# Patient Record
Sex: Male | Born: 1985 | Race: Black or African American | Hispanic: No | Marital: Single | Smoking: Never smoker
Health system: Southern US, Community
[De-identification: ages and names within clinical notes are randomized; demographics above are authoritative.]

## PROBLEM LIST (undated history)

## (undated) ENCOUNTER — Ambulatory Visit (HOSPITAL_COMMUNITY): Admission: EM

---

## 2014-03-26 ENCOUNTER — Emergency Department (HOSPITAL_COMMUNITY)
Admission: EM | Admit: 2014-03-26 | Discharge: 2014-03-27 | Disposition: A | Discharge status: Home / Self Care | Payer: No Typology Code available for payment source | Attending: Emergency Medicine | Admitting: Emergency Medicine

## 2014-03-26 ENCOUNTER — Encounter (HOSPITAL_COMMUNITY): Payer: Self-pay | Admitting: Emergency Medicine

## 2014-03-26 ENCOUNTER — Emergency Department (HOSPITAL_COMMUNITY): Payer: No Typology Code available for payment source

## 2014-03-26 DIAGNOSIS — S335XXA Sprain of ligaments of lumbar spine, initial encounter: Secondary | ICD-10-CM | POA: Insufficient documentation

## 2014-03-26 DIAGNOSIS — S39012A Strain of muscle, fascia and tendon of lower back, initial encounter: Secondary | ICD-10-CM

## 2014-03-26 DIAGNOSIS — Y9389 Activity, other specified: Secondary | ICD-10-CM | POA: Insufficient documentation

## 2014-03-26 DIAGNOSIS — S161XXA Strain of muscle, fascia and tendon at neck level, initial encounter: Secondary | ICD-10-CM

## 2014-03-26 DIAGNOSIS — Y92410 Unspecified street and highway as the place of occurrence of the external cause: Secondary | ICD-10-CM | POA: Insufficient documentation

## 2014-03-26 DIAGNOSIS — S139XXA Sprain of joints and ligaments of unspecified parts of neck, initial encounter: Secondary | ICD-10-CM | POA: Insufficient documentation

## 2014-03-26 MED ORDER — IBUPROFEN 800 MG PO TABS: 800.0000 mg | ORAL_TABLET | Freq: Three times a day (TID) | ORAL | Status: DC | PRN

## 2014-03-26 MED ORDER — HYDROCODONE-ACETAMINOPHEN 5-325 MG PO TABS: 1.0000 | ORAL_TABLET | Freq: Four times a day (QID) | ORAL | Status: DC | PRN
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
## 2014-03-26 NOTE — Discharge Instructions (Signed)
Return here as needed. Your x-rays were normal. Use ice and heat on your neck and back.  °

## 2014-03-27 NOTE — ED Provider Notes (Signed)
Medical screening examination/treatment/procedure(s) were performed by non-physician practitioner and as supervising physician I was immediately available for consultation/collaboration.  Khrystina Bonnes T Avelardo Reesman, MD 03/27/14 1552 

## 2014-12-28 ENCOUNTER — Emergency Department (HOSPITAL_COMMUNITY)
Admission: EM | Admit: 2014-12-28 | Discharge: 2014-12-29 | Disposition: A | Discharge status: Home / Self Care | Payer: 59 | Attending: Emergency Medicine | Admitting: Emergency Medicine

## 2014-12-28 ENCOUNTER — Encounter (HOSPITAL_COMMUNITY): Payer: Self-pay | Admitting: Emergency Medicine

## 2014-12-28 DIAGNOSIS — B9789 Other viral agents as the cause of diseases classified elsewhere: Secondary | ICD-10-CM

## 2014-12-28 DIAGNOSIS — J069 Acute upper respiratory infection, unspecified: Secondary | ICD-10-CM | POA: Diagnosis not present

## 2014-12-28 DIAGNOSIS — R05 Cough: Secondary | ICD-10-CM | POA: Diagnosis present

## 2014-12-28 NOTE — ED Notes (Signed)
Pt. reports productive cough , runny nose , chest congestion and headache onset this week , denies fever or chills.

## 2014-12-29 MED ORDER — BENZONATATE 100 MG PO CAPS
100.0000 mg | ORAL_CAPSULE | Freq: Once | ORAL | Status: AC
  Administered 2014-12-29: 100 mg via ORAL
  Filled 2014-12-29: qty 1

## 2014-12-29 MED ORDER — HYDROCODONE-ACETAMINOPHEN 5-325 MG PO TABS
1.0000 | ORAL_TABLET | Freq: Once | ORAL | Status: AC
  Administered 2014-12-29: 1 via ORAL
  Filled 2014-12-29: qty 1

## 2014-12-29 MED ORDER — BENZONATATE 100 MG PO CAPS: 100.0000 mg | ORAL_CAPSULE | Freq: Three times a day (TID) | ORAL | Status: DC

## 2014-12-29 MED ORDER — NAPROXEN 500 MG PO TABS: 500.0000 mg | ORAL_TABLET | Freq: Two times a day (BID) | ORAL | Status: DC

## 2014-12-29 MED ORDER — SALINE SPRAY 0.65 % NA SOLN: 1.0000 | NASAL | Status: DC | PRN

## 2014-12-29 NOTE — ED Notes (Signed)
Patient is alert and orientedx4.  Patient was explained discharge instructions and they understood them with no questions.  The patient's wife Paulino RilyFlorence Weah is taking the patient home.

## 2014-12-29 NOTE — ED Provider Notes (Signed)
CSN: [id number]     Arrival date & time 12/28/14  2349 History   First MD Initiated Contact with Patient 12/29/14 0013     Chief Complaint  Patient presents with  . Cough    (Consider location/radiation/quality/duration/timing/severity/associated sxs/prior Treatment) Patient is a 29 y.o. male presenting with cough. The history is provided by the patient. No language interpreter was used.  Cough Cough characteristics:  Non-productive Severity:  Moderate Onset quality:  Gradual Duration:  3 days Timing:  Intermittent Progression:  Worsening Chronicity:  New Smoker: no   Relieved by:  Nothing Worsened by:  Deep breathing Ineffective treatments: Ibuprofen and sinus congestion tablet. Associated symptoms: chest pain (R anterior chest wall), rhinorrhea and sinus congestion   Associated symptoms: no fever, no shortness of breath and no sore throat   Risk factors comment:  +sick contacts at work   History reviewed. No pertinent past medical history. History reviewed. No pertinent past surgical history. No family history on file. History  Substance Use Topics  . Smoking status: Never Smoker   . Smokeless tobacco: Never Used  . Alcohol Use: Yes     Comment: Socailly     Review of Systems  Constitutional: Positive for fatigue. Negative for fever.  HENT: Positive for congestion and rhinorrhea. Negative for sore throat and trouble swallowing.   Respiratory: Positive for cough. Negative for shortness of breath.   Cardiovascular: Positive for chest pain (R anterior chest wall).  Gastrointestinal: Negative for nausea and vomiting.  All other systems reviewed and are negative.   Allergies  Review of patient's allergies indicates no known allergies.  Home Medications   Prior to Admission medications   Medication Sig Start Date End Date Taking? Authorizing Provider  benzonatate (TESSALON) 100 MG capsule Take 1 capsule (100 mg total) by mouth every 8 (eight) hours. 12/29/14   Antony MaduraKelly  Adonai Selsor, PA-C  HYDROcodone-acetaminophen (NORCO/VICODIN) 5-325 MG per tablet Take 1 tablet by mouth every 6 (six) hours as needed for moderate pain. 03/26/14   Jamesetta Orleanshristopher W Lawyer, PA-C  ibuprofen (ADVIL,MOTRIN) 800 MG tablet Take 1 tablet (800 mg total) by mouth every 8 (eight) hours as needed. 03/26/14   Jamesetta Orleanshristopher W Lawyer, PA-C  naproxen (NAPROSYN) 500 MG tablet Take 1 tablet (500 mg total) by mouth 2 (two) times daily. 12/29/14   Antony MaduraKelly Aeron Donaghey, PA-C  sodium chloride (OCEAN) 0.65 % SOLN nasal spray Place 1 spray into both nostrils as needed for congestion. 12/29/14   Antony MaduraKelly Goldman Birchall, PA-C   BP 117/74 mmHg  Pulse 87  Temp(Src) 98.1 F (36.7 C) (Oral)  Resp 17  Ht 5\' 8"  (1.727 m)  Wt 193 lb (87.544 kg)  BMI 29.35 kg/m2  SpO2 97%   Physical Exam  Constitutional: He is oriented to person, place, and time. He appears well-developed and well-nourished. No distress.  Nontoxic/nonseptic appearing  HENT:  Head: Normocephalic and atraumatic.  Right Ear: Tympanic membrane, external ear and ear canal normal.  Left Ear: Tympanic membrane, external ear and ear canal normal.  Nose: Rhinorrhea present.  Mouth/Throat: Uvula is midline and mucous membranes are normal. Posterior oropharyngeal erythema present. No oropharyngeal exudate.  Mild posterior erythema in the oropharynx. Uvula midline. Patient tolerating secretions without difficulty.  Eyes: Conjunctivae and EOM are normal. No scleral icterus.  Neck: Normal range of motion.  Cardiovascular: Normal rate, regular rhythm and normal heart sounds.   Pulmonary/Chest: Effort normal and breath sounds normal. No respiratory distress. He has no wheezes. He has no rales. He exhibits tenderness and bony  tenderness. He exhibits no crepitus, no deformity and no retraction.    Lungs clear bilaterally. No wheezing or rales. No accessory muscle use. No tachypnea or dyspnea.  Abdominal: Soft. He exhibits no distension. There is no tenderness.  Soft, nontender. No masses.   Musculoskeletal: Normal range of motion.  Neurological: He is alert and oriented to person, place, and time. He exhibits normal muscle tone. Coordination normal.  Skin: Skin is warm and dry. No rash noted. He is not diaphoretic. No erythema. No pallor.  Psychiatric: He has a normal mood and affect. His behavior is normal.  Nursing note and vitals reviewed.   ED Course  Procedures (including critical care time) Labs Review Labs Reviewed - No data to display  Imaging Review No results found.   EKG Interpretation None      MDM   Final diagnoses:  Viral URI with cough    Patients symptoms are consistent with URI, likely viral etiology. Physical exam also consistent with costochondral chest pain secondary to frequent coughing. Discussed that antibiotics are not indicated for viral infections. Doubt PNA given lack of fever, tachypnea, dyspnea, or hypoxia. Lungs CTAB. No indication for further work up/imaging. Patient will be discharged with symptomatic treatment. Patient verbalizes understanding and is agreeable with plan. Patient is hemodynamically stable and in NAD prior to discharge. Patient discharged in good condition; vitals WNL.   Filed Vitals:   12/28/14 2354  BP: 117/74  Pulse: 87  Temp: 98.1 F (36.7 C)  TempSrc: Oral  Resp: 17  Height:  (1.727 m)  Weight: 193 lb (87.544 kg)  SpO2: 97%        Antony Madura, PA-C 12/29/14 0038  Dione Booze, MD 12/29/14 0104

## 2014-12-29 NOTE — Discharge Instructions (Signed)
Recommend Naproxen and Tessalon as prescribed. Do not take ibuprofen/Motrin with Naproxen. You may use nasal saline spray for congestion. You may also use over the counter cough medications as needed. Follow up with your primary care doctor for a symptom recheck in 2 days.  Cough, Adult  A cough is a reflex that helps clear your throat and airways. It can help heal the body or may be a reaction to an irritated airway. A cough may only last 2 or 3 weeks (acute) or may last more than 8 weeks (chronic).  CAUSES Acute cough:  Viral or bacterial infections. Chronic cough:  Infections.  Allergies.  Asthma.  Post-nasal drip.  Smoking.  Heartburn or acid reflux.  Some medicines.  Chronic lung problems (COPD).  Cancer. SYMPTOMS   Cough.  Fever.  Chest pain.  Increased breathing rate.  High-pitched whistling sound when breathing (wheezing).  Colored mucus that you cough up (sputum). TREATMENT   A bacterial cough may be treated with antibiotic medicine.  A viral cough must run its course and will not respond to antibiotics.  Your caregiver may recommend other treatments if you have a chronic cough. HOME CARE INSTRUCTIONS   Only take over-the-counter or prescription medicines for pain, discomfort, or fever as directed by your caregiver. Use cough suppressants only as directed by your caregiver.  Use a cold steam vaporizer or humidifier in your bedroom or home to help loosen secretions.  Sleep in a semi-upright position if your cough is worse at night.  Rest as needed.  Stop smoking if you smoke. SEEK IMMEDIATE MEDICAL CARE IF:   You have pus in your sputum.  Your cough starts to worsen.  You cannot control your cough with suppressants and are losing sleep.  You begin coughing up blood.  You have difficulty breathing.  You develop pain which is getting worse or is uncontrolled with medicine.  You have a fever. MAKE SURE YOU:   Understand these  instructions.  Will watch your condition.  Will get help right away if you are not doing well or get worse. Document Released: 06/07/2011 Document Revised: 03/02/2012 Document Reviewed: 06/07/2011 [facility] Patient Information 2015 [employer] [place]. This information is not intended to replace advice given to you by your health care provider. Make sure you discuss any questions you have with your health care provider. Costochondritis Costochondritis, sometimes called Tietze syndrome, is a swelling and irritation (inflammation) of the tissue (cartilage) that connects your ribs with your breastbone (sternum). It causes pain in the chest and rib area. Costochondritis usually goes away on its own over time. It can take up to 6 weeks or longer to get better, especially if you are unable to limit your activities. CAUSES  Some cases of costochondritis have no known cause. Possible causes include:  Injury (trauma).  Exercise or activity such as lifting.  Severe coughing. SIGNS AND SYMPTOMS  Pain and tenderness in the chest and rib area.  Pain that gets worse when coughing or taking deep breaths.  Pain that gets worse with specific movements. DIAGNOSIS  Your health care provider will do a physical exam and ask about your symptoms. Chest X-rays or other tests may be done to rule out other problems. TREATMENT  Costochondritis usually goes away on its own over time. Your health care provider may prescribe medicine to help relieve pain. HOME CARE INSTRUCTIONS   Avoid exhausting physical activity. Try not to strain your ribs during normal activity. This would include any activities using chest, abdominal, and  side muscles, especially if heavy weights are used.  Apply ice to the affected area for the first 2 days after the pain begins.  Put ice in a plastic bag.  Place a towel between your skin and the bag.  Leave the ice on for 20 minutes, 2-3 times a day.  Only take over-the-counter or  prescription medicines as directed by your health care provider. SEEK MEDICAL CARE IF:  You have redness or swelling at the rib joints. These are signs of infection.  Your pain does not go away despite rest or medicine. SEEK IMMEDIATE MEDICAL CARE IF:   Your pain increases or you are very uncomfortable.  You have shortness of breath or difficulty breathing.  You cough up blood.  You have worse chest pains, sweating, or vomiting.  You have a fever or persistent symptoms for more than 2-3 days.  You have a fever and your symptoms suddenly get worse. MAKE SURE YOU:   Understand these instructions.  Will watch your condition.  Will get help right away if you are not doing well or get worse. Document Released: 09/18/2005 Document Revised: 09/29/2013 Document Reviewed: 07/13/2013 [facility] Patient Information 2015 [place], [place]. This information is not intended to replace advice given to you by your health care provider. Make sure you discuss any questions you have with your health care provider.

## 2014-12-30 ENCOUNTER — Emergency Department (HOSPITAL_COMMUNITY)
Admission: EM | Admit: 2014-12-30 | Discharge: 2014-12-30 | Disposition: A | Discharge status: Home / Self Care | Payer: 59 | Attending: Emergency Medicine | Admitting: Emergency Medicine

## 2014-12-30 ENCOUNTER — Encounter (HOSPITAL_COMMUNITY): Payer: Self-pay | Admitting: Emergency Medicine

## 2014-12-30 ENCOUNTER — Emergency Department (HOSPITAL_COMMUNITY): Payer: 59

## 2014-12-30 DIAGNOSIS — R059 Cough, unspecified: Secondary | ICD-10-CM

## 2014-12-30 DIAGNOSIS — J40 Bronchitis, not specified as acute or chronic: Secondary | ICD-10-CM

## 2014-12-30 DIAGNOSIS — R05 Cough: Secondary | ICD-10-CM | POA: Diagnosis present

## 2014-12-30 DIAGNOSIS — R0789 Other chest pain: Secondary | ICD-10-CM

## 2014-12-30 MED ORDER — IBUPROFEN 200 MG PO TABS
600.0000 mg | ORAL_TABLET | Freq: Once | ORAL | Status: AC
  Administered 2014-12-30: 600 mg via ORAL
  Filled 2014-12-30: qty 3

## 2014-12-30 NOTE — ED Notes (Signed)
Pt A&OX4, ambulatory at d/c with steady gait, NAD 

## 2014-12-30 NOTE — ED Notes (Signed)
Pt. requesting additional 2 days off from work " work note" , seenhere 2 days ago for cold symptoms discharged with prescription medications diagnosed with viral URI/cough.

## 2014-12-30 NOTE — ED Provider Notes (Signed)
CSN: [id number]     Arrival date & time 12/30/14  0004 History  This chart was scribed for Loren Raceravid Junie Engram, MD by Annye AsaAnna Dorsett, ED Scribe. This patient was seen in room A08C/A08C and the patient's care was started at 2:05 AM.    Chief Complaint  Patient presents with  . Follow-up   HPI   HPI Comments: Steven PeaksSamuel Kidd is an otherwise healthy 29 y.o. male who presents to the Emergency Department complaining of 3 days of URI symptoms: he reports productive cough (green phlegm), right side pain worse with coughing and movement. His sore throat has resolved at present. He has not taken any medications for his symptoms today. He denies rhinorrhea, fever.   Patient was seen here 2 days PTA for the same symptoms; at that time he was discharged with prescription medications for a viral URI/cough. He was told "by his job" to return to the doctor for a work note to be excused from work until he feels well enough to return.   History reviewed. No pertinent past medical history. History reviewed. No pertinent past surgical history. No family history on file. History  Substance Use Topics  . Smoking status: Never Smoker   . Smokeless tobacco: Never Used  . Alcohol Use: Yes     Comment: Socailly     Review of Systems  Constitutional: Positive for fatigue. Negative for fever and chills.  HENT: Negative for congestion, rhinorrhea and sinus pressure.   Respiratory: Positive for cough. Negative for shortness of breath.   Cardiovascular: Positive for chest pain. Negative for palpitations and leg swelling.  Gastrointestinal: Negative for nausea, vomiting, abdominal pain, diarrhea and constipation.  Genitourinary: Negative for dysuria and flank pain.  Musculoskeletal: Positive for myalgias. Negative for back pain, neck pain and neck stiffness.  Skin: Negative for rash and wound.  Neurological: Negative for dizziness, weakness, light-headedness, numbness and headaches.  All other systems reviewed and are  negative.     Allergies  Review of patient's allergies indicates no known allergies.  Home Medications   Prior to Admission medications   Medication Sig Start Date End Date Taking? Authorizing Provider  benzonatate (TESSALON) 100 MG capsule Take 1 capsule (100 mg total) by mouth every 8 (eight) hours. 12/29/14   Antony MaduraKelly Humes, PA-C  HYDROcodone-acetaminophen (NORCO/VICODIN) 5-325 MG per tablet Take 1 tablet by mouth every 6 (six) hours as needed for moderate pain. 03/26/14   Jamesetta Orleanshristopher W Lawyer, PA-C  ibuprofen (ADVIL,MOTRIN) 800 MG tablet Take 1 tablet (800 mg total) by mouth every 8 (eight) hours as needed. 03/26/14   Jamesetta Orleanshristopher W Lawyer, PA-C  naproxen (NAPROSYN) 500 MG tablet Take 1 tablet (500 mg total) by mouth 2 (two) times daily. 12/29/14   Antony MaduraKelly Humes, PA-C  sodium chloride (OCEAN) 0.65 % SOLN nasal spray Place 1 spray into both nostrils as needed for congestion. 12/29/14   Antony MaduraKelly Humes, PA-C   BP 110/72 mmHg  Pulse 80  Temp(Src) 98.6 F (37 C) (Oral)  Resp 18  SpO2 100% Physical Exam  Constitutional: He is oriented to person, place, and time. He appears well-developed and well-nourished. No distress.  HENT:  Head: Normocephalic and atraumatic.  Mouth/Throat: Oropharynx is clear and moist. No oropharyngeal exudate.  Eyes: EOM are normal. Pupils are equal, round, and reactive to light.  Neck: Normal range of motion. Neck supple.  Cardiovascular: Normal rate and regular rhythm.   Pulmonary/Chest: Effort normal and breath sounds normal. No respiratory distress. He has no wheezes. He has no rales. He  exhibits tenderness (Tenderness to palpation over the right inferior lateral chest. there is no crepitance or deformity.).  Abdominal: Soft. Bowel sounds are normal. He exhibits no distension and no mass. There is no tenderness. There is no rebound and no guarding.  Musculoskeletal: Normal range of motion. He exhibits no edema or tenderness.  No lower extremity swelling or pain. Distal  pulses intact.  Neurological: He is alert and oriented to person, place, and time.  Moves all extremities without deficit. Sensation is intact. Ambulating without difficulty.  Skin: Skin is warm and dry. No rash noted. No erythema.  Psychiatric: He has a normal mood and affect. His behavior is normal.  Nursing note and vitals reviewed.   ED Course  Procedures   DIAGNOSTIC STUDIES: Oxygen Saturation is 97% on RA, normal by my interpretation.    COORDINATION OF CARE: 2:10 AM Discussed treatment plan with pt at bedside and pt agreed to plan.   Labs Review Labs Reviewed - No data to display  Imaging Review No results found.   EKG Interpretation None      MDM   Final diagnoses:  Cough  Bronchitis  Right-sided chest wall pain    I personally performed the services described in this documentation, which was scribed in my presence. The recorded information has been reviewed and considered.  Low suspicion for PE. His chest wall pain to pull likely from muscle strain due to coughing. Return precautions given   Loren Racer, MD 01/05/15 1028

## 2014-12-30 NOTE — Discharge Instructions (Signed)

## 2015-11-30 ENCOUNTER — Emergency Department (INDEPENDENT_AMBULATORY_CARE_PROVIDER_SITE_OTHER)
Admission: EM | Admit: 2015-11-30 | Discharge: 2015-11-30 | Disposition: A | Discharge status: Home / Self Care | Payer: Self-pay | Source: Home / Self Care | Attending: Family Medicine | Admitting: Family Medicine

## 2015-11-30 ENCOUNTER — Encounter (HOSPITAL_COMMUNITY): Payer: Self-pay | Admitting: Emergency Medicine

## 2015-11-30 DIAGNOSIS — Z23 Encounter for immunization: Secondary | ICD-10-CM

## 2015-11-30 DIAGNOSIS — S91114A Laceration without foreign body of right lesser toe(s) without damage to nail, initial encounter: Secondary | ICD-10-CM

## 2015-11-30 MED ORDER — TETANUS-DIPHTH-ACELL PERTUSSIS 5-2.5-18.5 LF-MCG/0.5 IM SUSP
0.5000 mL | Freq: Once | INTRAMUSCULAR | Status: AC
  Administered 2015-11-30: 0.5 mL via INTRAMUSCULAR

## 2015-11-30 MED ORDER — TETANUS-DIPHTH-ACELL PERTUSSIS 5-2.5-18.5 LF-MCG/0.5 IM SUSP
INTRAMUSCULAR | Status: AC
  Filled 2015-11-30: qty 0.5
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
## 2015-11-30 NOTE — ED Provider Notes (Signed)
CSN: [id number]     Arrival date & time 11/30/15  1406 History   First MD Initiated Contact with Patient 11/30/15 1456     Chief Complaint  Patient presents with  . Extremity Laceration  . Toe Injury   (Consider location/radiation/quality/duration/timing/severity/associated sxs/prior Treatment) The history is provided by the patient. No language interpreter was used.  Patient complains of pain to right fifth toe, started on 11/25/15 when glass table broke on his foot.  Large pieces of glass, one of which sheared against the lateral edge of his right fifth toe.  Was bleeding heavily, so EMS was called. They evaluated his vital signs and wrapped the toe.  He has been unable to wear steel-toed shoes that he needs for work, so he has not returned there.  COmes today in flip-flops.  Is able to bear weight and has not had any recurrence of bleeding.   ROS No fevers or chills.   History reviewed. No pertinent past medical history. History reviewed. No pertinent past surgical history. History reviewed. No pertinent family history. Social History  Substance Use Topics  . Smoking status: Never Smoker   . Smokeless tobacco: Never Used  . Alcohol Use: Yes     Comment: Socailly     Review of Systems  Allergies  Review of patient's allergies indicates no known allergies.  Home Medications   Prior to Admission medications   Medication Sig Start Date End Date Taking? Authorizing Provider  benzonatate (TESSALON) 100 MG capsule Take 1 capsule (100 mg total) by mouth every 8 (eight) hours. 12/29/14   Antony Madura, PA-C  HYDROcodone-acetaminophen (NORCO/VICODIN) 5-325 MG per tablet Take 1 tablet by mouth every 6 (six) hours as needed for moderate pain. 03/26/14   Charlestine Night, PA-C  ibuprofen (ADVIL,MOTRIN) 800 MG tablet Take 1 tablet (800 mg total) by mouth every 8 (eight) hours as needed. 03/26/14   Charlestine Night, PA-C  naproxen (NAPROSYN) 500 MG tablet Take 1 tablet (500 mg total) by mouth 2  (two) times daily. 12/29/14   Antony Madura, PA-C  sodium chloride (OCEAN) 0.65 % SOLN nasal spray Place 1 spray into both nostrils as needed for congestion. 12/29/14   Antony Madura, PA-C   Meds Ordered and Administered this Visit  Medications - No data to display  BP 126/91 mmHg  Pulse 84  Temp(Src) 98 F (36.7 C) (Oral)  Resp 16  SpO2 100% No data found.   Physical Exam  Constitutional: He appears well-developed and well-nourished. No distress.  HENT:  Head: Normocephalic and atraumatic.  Neck: Normal range of motion. Neck supple.  Musculoskeletal:  Right foot with palpable dp pulse, no edema or erythema.  Lateral aspect of fifth toe with superficial shear of skin, early eschar formation, No purulence or fluctuance. No bleeding. Evaluation wtihout evidence of foreign body in the wound. Distal toe sensation grossly intact and comparable to other toes.  Able to wiggle toes without pain or limitation.    Skin: He is not diaphoretic.    ED Course  Procedures (including critical care time)  Labs Review Labs Reviewed - No data to display  Imaging Review No results found.   Visual Acuity Review  Right Eye Distance:   Left Eye Distance:   Bilateral Distance:    Right Eye Near:   Left Eye Near:    Bilateral Near:         MDM   1. Laceration of fifth toe, right, initial encounter    Shearing laceration of right fifth  toe, does not appear to have soft tissue infection or fb retention. Tdap today. Note for work to return at next shift on 12/12.     Barbaraann BarthelJames O Newton Frutiger, MD 11/30/15 [phone]

## 2015-11-30 NOTE — Discharge Instructions (Signed)
It was a pleasure to see you today for the laceration to your right fifth toe.   As we discussed, it is important to keep it wrapped and prevent it from rubbing against your shoe/boot.  I believe you may return to work, provided it is kept padded while inside the boot.   Tylenol or ibuprofen as needed for the pain.  When you return home from work, you may remove the boots and soak the foot in warm water with epsom salts.  Please return to the urgent care center if the toe becomes red or swollen, if the pain increases, if a pus-like discharge or foul odor develops from the wound, if you develop fevers/chills, or with other concerns.

## 2015-12-26 ENCOUNTER — Encounter (HOSPITAL_COMMUNITY): Payer: Self-pay | Admitting: Emergency Medicine

## 2015-12-26 ENCOUNTER — Emergency Department (INDEPENDENT_AMBULATORY_CARE_PROVIDER_SITE_OTHER)
Admission: EM | Admit: 2015-12-26 | Discharge: 2015-12-26 | Disposition: A | Discharge status: Home / Self Care | Payer: Self-pay | Source: Home / Self Care | Attending: Emergency Medicine | Admitting: Emergency Medicine

## 2015-12-26 DIAGNOSIS — Z5189 Encounter for other specified aftercare: Secondary | ICD-10-CM

## 2015-12-26 NOTE — ED Provider Notes (Signed)
CSN: [id number]     Arrival date & time 12/26/15  1830 History   First MD Initiated Contact with Patient 12/26/15 1910     Chief Complaint  Patient presents with  . Medical Clearance   (Consider location/radiation/quality/duration/timing/severity/associated sxs/prior Treatment) HPI Comments: 30 year old male presents to the urgent care requesting a note to return back to work after injury to the right fifth toe. This occurred early in December. He apparently had a skin avulsion to the right fifth toe. It had been healing well but he states that having to wear steel toed boots at work produced pain and he cannot wear them. He apparently is been out of work since that time and is now 518, back to work wearing his shoes. The wound is healing nicely. It has crusted over. It is dry. There is no erythema, drainage, bleeding or swelling. No lymphangitis. No tenderness.   History reviewed. No pertinent past medical history. History reviewed. No pertinent past surgical history. No family history on file. Social History  Substance Use Topics  . Smoking status: Never Smoker   . Smokeless tobacco: Never Used  . Alcohol Use: Yes     Comment: Socailly     Review of Systems  Constitutional: Negative.   Skin: Positive for wound.  All other systems reviewed and are negative.   Allergies  Review of patient's allergies indicates no known allergies.  Home Medications   Prior to Admission medications   Medication Sig Start Date End Date Taking? Authorizing Provider  benzonatate (TESSALON) 100 MG capsule Take 1 capsule (100 mg total) by mouth every 8 (eight) hours. 12/29/14   Antony MaduraKelly Humes, PA-C  HYDROcodone-acetaminophen (NORCO/VICODIN) 5-325 MG per tablet Take 1 tablet by mouth every 6 (six) hours as needed for moderate pain. 03/26/14   Charlestine Nighthristopher Lawyer, PA-C  ibuprofen (ADVIL,MOTRIN) 800 MG tablet Take 1 tablet (800 mg total) by mouth every 8 (eight) hours as needed. 03/26/14   Charlestine Nighthristopher Lawyer, PA-C   naproxen (NAPROSYN) 500 MG tablet Take 1 tablet (500 mg total) by mouth 2 (two) times daily. 12/29/14   Antony MaduraKelly Humes, PA-C  sodium chloride (OCEAN) 0.65 % SOLN nasal spray Place 1 spray into both nostrils as needed for congestion. 12/29/14   Antony MaduraKelly Humes, PA-C   Meds Ordered and Administered this Visit  Medications - No data to display  BP 133/99 mmHg  Pulse 72  Temp(Src) 98.2 F (36.8 C) (Oral)  Resp 16  SpO2 98% No data found.   Physical Exam  Constitutional: He appears well-developed and well-nourished. No distress.  Pulmonary/Chest: Effort normal. No respiratory distress.  Musculoskeletal: Normal range of motion. He exhibits no edema or tenderness.  See history of present illness for physical description of the right fifth toe.  Neurological: He is alert. He exhibits normal muscle tone.  Skin: Skin is warm and dry. No rash noted. No erythema.  Psychiatric: He has a normal mood and affect.  Nursing note and vitals reviewed.   ED Course  Procedures (including critical care time)  Labs Review Labs Reviewed - No data to display  Imaging Review No results found.   Visual Acuity Review  Right Eye Distance:   Left Eye Distance:   Bilateral Distance:    Right Eye Near:   Left Eye Near:    Bilateral Near:         MDM   1. Encounter for wound re-check    As above, wound is healing well and no signs of infection. May return to  work. Recommend wearing a cushion over the toe to prevent pressure and rubbing.     Hayden Rasmussen, NP 12/26/15 2002

## 2015-12-26 NOTE — Discharge Instructions (Signed)
°  Wound is healing nicely. There are no signs of infection. Continue to keep it clean and dry. He may need to keep it covered and cushioned while wearing your shoes. May return to work.

## 2015-12-26 NOTE — ED Notes (Signed)
Pt reports he needs medical clearance to go back to work... Seen here on 12/8 for a laceration to right 5th toe Steady gait... A&O x4... No acute distress.

## 2018-10-08 ENCOUNTER — Ambulatory Visit (HOSPITAL_COMMUNITY)
Admission: EM | Admit: 2018-10-08 | Discharge: 2018-10-08 | Disposition: A | Discharge status: Home / Self Care | Payer: Self-pay | Attending: Family Medicine | Admitting: Family Medicine

## 2018-10-08 ENCOUNTER — Ambulatory Visit (INDEPENDENT_AMBULATORY_CARE_PROVIDER_SITE_OTHER): Payer: Self-pay

## 2018-10-08 ENCOUNTER — Encounter (HOSPITAL_COMMUNITY): Payer: Self-pay | Admitting: Emergency Medicine

## 2018-10-08 DIAGNOSIS — S93492A Sprain of other ligament of left ankle, initial encounter: Secondary | ICD-10-CM

## 2018-10-08 DIAGNOSIS — M25572 Pain in left ankle and joints of left foot: Secondary | ICD-10-CM

## 2018-10-08 MED ORDER — DICLOFENAC SODIUM 75 MG PO TBEC: 75.0000 mg | DELAYED_RELEASE_TABLET | Freq: Two times a day (BID) | ORAL | 0 refills | Status: DC

## 2018-10-08 NOTE — ED Provider Notes (Signed)
[facility]   [id number] 10/08/18 Arrival Time: 1132  ASSESSMENT & PLAN:  1. Acute left ankle pain   2. Sprain of anterior talofibular ligament of left ankle, initial encounter    I have personally viewed the imaging studies ordered today. No fracture seen.  Imaging: Dg Ankle Complete Left  Result Date: 10/08/2018 CLINICAL DATA:  Fall. EXAM: LEFT ANKLE COMPLETE - 3+ VIEW COMPARISON:  No prior. FINDINGS: Soft tissue swelling. No acute bony abnormality. Corticated bony density noted adjacent to the medial and lateral malleoli, possibly an old small fracture fragment. No evidence of acute fracture. IMPRESSION: Soft tissue swelling. Tiny corticated bony density noted adjacent to the medial malleolus, possibly an old fracture fragment. No acute bony abnormality. Electronically Signed   By: Maisie Fus  Register   On: 10/08/2018 12:39    Meds ordered this encounter  Medications  . diclofenac (VOLTAREN) 75 MG EC tablet    Sig: Take 1 tablet (75 mg total) by mouth 2 (two) times daily.    Dispense:  14 tablet    Refill:  0   Work note given with restrictions. Natural history and expected course discussed. Questions answered. Rest, ice, compression, elevation (RICE) therapy. Crutches and instructions provided. Transport planner distributed. NSAIDs per medication orders. ASO applied.  Reviewed expectations re: course of current medical issues. Questions answered. Outlined signs and symptoms indicating need for more acute intervention. Patient verbalized understanding. After Visit Summary given.  SUBJECTIVE: History from: patient. Steven Kidd is a 32 y.o. male who reports persistent mild to moderate pain of his left lateral ankle; described as aching without radiation. Injury/trama: yes, reports twisting while jogging yesterday; able to bear weight but with discomfort. Symptoms have stabilized since beginning. Relieved by: rest. Worsened by: certain movements and weight  bearing. Associated symptoms: none reported. Extremity sensation changes or weakness: none. Self treatment: ibuprofen, single dose, mild help History of similar: no.  History reviewed. No pertinent surgical history.   ROS: As per HPI.   OBJECTIVE:  Vitals:   10/08/18 1214 10/08/18 1215  BP:  128/86  Pulse: 68   Resp: 18   Temp: 98 F (36.7 C)   SpO2: 100%     General appearance: alert; no distress Extremities: warm and well perfused; well localized mild to moderate tenderness over his left lateral ankle, ATFL distribution; without gross deformities; with mild swelling and no bruising; normal left ankle ROM with reported lateral discomfort; no instability CV: brisk extremity capillary refill; 2+ DP/PT pulses of LLE. Skin: warm and dry; no visible rashes Neurologic: normal gait; normal reflexes of LLE; normal sensation of LLE Psychological: alert and cooperative; normal mood and affect  No Known Allergies   Social History   Socioeconomic History  . Marital status: Married    Spouse name: Not on file  . Number of children: Not on file  . Years of education: Not on file  . Highest education level: Not on file  Occupational History  . Not on file  Social Needs  . Financial resource strain: Not on file  . Food insecurity:    Worry: Not on file    Inability: Not on file  . Transportation needs:    Medical: Not on file    Non-medical: Not on file  Tobacco Use  . Smoking status: Never Smoker  . Smokeless tobacco: Never Used  Substance and Sexual Activity  . Alcohol use: Yes    Comment: Socailly   . Drug use: No  . Sexual activity: Not  on file  Lifestyle  . Physical activity:    Days per week: Not on file    Minutes per session: Not on file  . Stress: Not on file  Relationships  . Social connections:    Talks on phone: Not on file    Gets together: Not on file    Attends religious service: Not on file    Active member of club or organization: Not on file     Attends meetings of clubs or organizations: Not on file    Relationship status: Not on file  Other Topics Concern  . Not on file  Social History Narrative  . Not on file    History reviewed. No pertinent surgical history.    Mardella Layman, MD 10/08/18 1300

## 2018-10-08 NOTE — ED Triage Notes (Signed)
Pt states yseterday he was jogging and he slipped and twisted his L ankle. Pt is limping, mild swelling to ankle.

## 2019-06-28 ENCOUNTER — Other Ambulatory Visit: Payer: Self-pay

## 2019-06-28 ENCOUNTER — Ambulatory Visit (HOSPITAL_COMMUNITY)
Admission: EM | Admit: 2019-06-28 | Discharge: 2019-06-28 | Disposition: A | Discharge status: Home / Self Care | Payer: Self-pay | Attending: Emergency Medicine | Admitting: Emergency Medicine

## 2019-06-28 ENCOUNTER — Encounter (HOSPITAL_COMMUNITY): Payer: Self-pay | Admitting: Emergency Medicine

## 2019-06-28 ENCOUNTER — Telehealth (HOSPITAL_COMMUNITY): Payer: Self-pay | Admitting: Emergency Medicine

## 2019-06-28 DIAGNOSIS — R1013 Epigastric pain: Secondary | ICD-10-CM | POA: Insufficient documentation

## 2019-06-28 DIAGNOSIS — R1011 Right upper quadrant pain: Secondary | ICD-10-CM | POA: Insufficient documentation

## 2019-06-28 LAB — COMPREHENSIVE METABOLIC PANEL
ALT: 180 U/L — ABNORMAL HIGH (ref 0–44)
AST: 212 U/L — ABNORMAL HIGH (ref 15–41)
Albumin: 4.6 g/dL (ref 3.5–5.0)
Alkaline Phosphatase: 78 U/L (ref 38–126)
Anion gap: 12 (ref 5–15)
BUN: 6 mg/dL (ref 6–20)
CO2: 24 mmol/L (ref 22–32)
Calcium: 9.8 mg/dL (ref 8.9–10.3)
Chloride: 100 mmol/L (ref 98–111)
Creatinine, Ser: 1.08 mg/dL (ref 0.61–1.24)
GFR calc Af Amer: >60 mL/min (ref >60)
GFR calc non Af Amer: >60 mL/min (ref >60)
Glucose, Bld: 119 mg/dL — ABNORMAL HIGH (ref 70–99)
Potassium: 4.3 mmol/L (ref 3.5–5.1)
Sodium: 136 mmol/L (ref 135–145)
Total Bilirubin: 1.5 mg/dL — ABNORMAL HIGH (ref 0.3–1.2)
Total Protein: 8.1 g/dL (ref 6.5–8.1)

## 2019-06-28 LAB — LIPASE, BLOOD: Lipase: 35 U/L (ref 11–51)

## 2019-06-28 LAB — CBC
HCT: 48.1 % (ref 39.0–52.0)
Hemoglobin: 17.3 g/dL — ABNORMAL HIGH (ref 13.0–17.0)
MCH: 29.1 pg (ref 26.0–34.0)
MCHC: 36 g/dL (ref 30.0–36.0)
MCV: 80.8 fL (ref 80.0–100.0)
Platelets: 180 10*3/uL (ref 150–400)
RBC: 5.95 MIL/uL — ABNORMAL HIGH (ref 4.22–5.81)
RDW: 13.3 % (ref 11.5–15.5)
WBC: 4.4 10*3/uL (ref 4.0–10.5)
nRBC: 0 % (ref 0.0–0.2)

## 2019-06-28 MED ORDER — POLYETHYLENE GLYCOL 3350 17 GM/SCOOP PO POWD: 17.0000 g | Freq: Every day | ORAL | 0 refills | Status: DC

## 2019-06-28 MED ORDER — ALUM & MAG HYDROXIDE-SIMETH 200-200-20 MG/5ML PO SUSP
ORAL | Status: AC
  Filled 2019-06-28: qty 30

## 2019-06-28 MED ORDER — ALUM & MAG HYDROXIDE-SIMETH 200-200-20 MG/5ML PO SUSP
30.0000 mL | Freq: Once | ORAL | Status: AC
  Administered 2019-06-28: 30 mL via ORAL

## 2019-06-28 MED ORDER — LIDOCAINE VISCOUS HCL 2 % MT SOLN
15.0000 mL | Freq: Once | OROMUCOSAL | Status: AC
  Administered 2019-06-28: 15 mL via ORAL

## 2019-06-28 MED ORDER — OMEPRAZOLE 20 MG PO CPDR: 20.0000 mg | DELAYED_RELEASE_CAPSULE | Freq: Every day | ORAL | 0 refills | Status: DC

## 2019-06-28 MED ORDER — LIDOCAINE VISCOUS HCL 2 % MT SOLN
OROMUCOSAL | Status: AC
  Filled 2019-06-28: qty 15

## 2019-06-28 NOTE — ED Provider Notes (Signed)
[facility]    CSN: [id number] Arrival date & time: 06/28/19  0957      History   Chief Complaint Chief Complaint  Patient presents with  . Abdominal Pain    HPI Steven Kidd is a 33 y.o. male.   Steven Kidd presents with complaints of upper abdominal pain which started two days ago. Worse with eating. Nausea, no vomiting. Burning pain. Has had intermittently for years now. He feels like it has worsened since gaining weight. States he has had looser bowel movements over the past two days. Prior to this had hard BM's and had to strain. Has eaten much less today due to symptoms, but is still drinking fluids. No fevers. No blood in stool. No recent travel. No urinary symptoms. States he tried taking medication which he was provided with for his ankle, which did not help his symptoms. Per chart review this was diclofenac. Without contributing medical history.     ROS per HPI, negative if not otherwise mentioned.      History reviewed. No pertinent past medical history.  There are no active problems to display for this patient.   History reviewed. No pertinent surgical history.     Home Medications    Prior to Admission medications   Medication Sig Start Date End Date Taking? Authorizing Provider  diclofenac (VOLTAREN) 75 MG EC tablet Take 1 tablet (75 mg total) by mouth 2 (two) times daily. 10/08/18  Yes Hagler, Aaron Edelman, MD  omeprazole (PRILOSEC) 20 MG capsule Take 1 capsule (20 mg total) by mouth daily. 06/28/19   Augusto Gamble B, NP  polyethylene glycol powder (GLYCOLAX/MIRALAX) 17 GM/SCOOP powder Take 17 g by mouth daily. 06/28/19   Zigmund Gottron, NP    Family History History reviewed. No pertinent family history.  Social History Social History   Tobacco Use  . Smoking status: Never Smoker  . Smokeless tobacco: Never Used  Substance Use Topics  . Alcohol use: Yes    Comment: Socailly   . Drug use: No     Allergies   Patient has no known allergies.    Review of Systems Review of Systems   Physical Exam Triage Vital Signs ED Triage Vitals  Enc Vitals Group     BP 06/28/19 1019 (!) 139/96     Pulse Rate 06/28/19 1019 91     Resp 06/28/19 1019 12     Temp 06/28/19 1019 98.8 F (37.1 C)     Temp Source 06/28/19 1019 Oral     SpO2 06/28/19 1019 100 %     Weight --      Height --      Head Circumference --      Peak Flow --      Pain Score 06/28/19 1017 6     Pain Loc --      Pain Edu? --      Excl. in [place]? --    No data found.  Updated Vital Signs BP (!) 139/96 (BP Location: Right Arm)   Pulse 91   Temp 98.8 F (37.1 C) (Oral)   Resp 12   SpO2 100%    Physical Exam Constitutional:      Appearance: He is well-developed.  Cardiovascular:     Rate and Rhythm: Normal rate and regular rhythm.  Pulmonary:     Effort: Pulmonary effort is normal.     Breath sounds: Normal breath sounds.  Abdominal:     Palpations: Abdomen is soft.  Tenderness: There is abdominal tenderness in the right upper quadrant and epigastric area. There is no right CVA tenderness, left CVA tenderness, guarding or rebound.  Skin:    General: Skin is warm and dry.  Neurological:     Mental Status: He is alert and oriented to person, place, and time.      UC Treatments / Results  Labs (all labs ordered are listed, but only abnormal results are displayed) Labs Reviewed  CBC  COMPREHENSIVE METABOLIC PANEL  LIPASE, BLOOD    EKG   Radiology No results found.  Procedures Procedures (including critical care time)  Medications Ordered in UC Medications  alum & mag hydroxide-simeth (MAALOX/MYLANTA) 200-200-20 MG/5ML suspension 30 mL (30 mLs Oral Given 06/28/19 1047)    And  lidocaine (XYLOCAINE) 2 % viscous mouth solution 15 mL (15 mLs Oral Given 06/28/19 1047)  alum & mag hydroxide-simeth (MAALOX/MYLANTA) 200-200-20 MG/5ML suspension (has no administration in time range)  lidocaine (XYLOCAINE) 2 % viscous mouth solution (has no  administration in time range)    Initial Impression / Assessment and Plan / UC Course  I have reviewed the triage vital signs and the nursing notes.  Pertinent labs & imaging results that were available during my care of the patient were reviewed by me and considered in my medical decision making (see chart for details).     gerd vs esophagitis vs gastroenteritis vs gallstones or cholecystitis vs ibs considered. Basic labs and lipase obtained and pending. Will notify of any positive findings and if any changes to treatment are needed.  miralax provided for prn use as does endorse constipation at times. Omeprazole provided for daily use. Return precautions provided. Encouraged establish with PCP for recheck. Patient verbalized understanding and agreeable to plan.   Final Clinical Impressions(s) / UC Diagnoses   Final diagnoses:  Epigastric pain  Right upper quadrant abdominal pain     Discharge Instructions     Please start daily omeprazole to see if this helps with your abdominal symptoms.  May use Miralax powder as needed for hard stools. Use as needed to keep stools regular.  Small frequent sips of fluids- Pedialyte, Gatorade, water, broth- to maintain hydration.   Bland diet as tolerated, advance as tolerated.  Please go to ER if worsening of pain, fevers, dehydration, or otherwise worsening.  Please establish with a primary care provider for recheck and management of your symptoms.    ED Prescriptions    Medication Sig Dispense Auth. Provider   omeprazole (PRILOSEC) 20 MG capsule Take 1 capsule (20 mg total) by mouth daily. 30 capsule Linus MakoBurky, Natalie B, NP   polyethylene glycol powder (GLYCOLAX/MIRALAX) 17 GM/SCOOP powder Take 17 g by mouth daily. 255 g Georgetta HaberBurky, Natalie B, NP     Controlled Substance Prescriptions [place] Controlled Substance Registry consulted? Not Applicable   Georgetta HaberBurky, Natalie B, NP 06/28/19 1112

## 2019-06-28 NOTE — Telephone Encounter (Signed)
Patient contacted and made aware of all results, all questions answered.  Pt given several numbers to call for PCP, pt instructed to go to the ER if getting worse, and follow up with PCP in 1 week for recheck of labs. Agreeable to plan, all questions answered.

## 2019-06-28 NOTE — Discharge Instructions (Signed)
Please start daily omeprazole to see if this helps with your abdominal symptoms.  May use Miralax powder as needed for hard stools. Use as needed to keep stools regular.  Small frequent sips of fluids- Pedialyte, Gatorade, water, broth- to maintain hydration.   Bland diet as tolerated, advance as tolerated.  Please go to ER if worsening of pain, fevers, dehydration, or otherwise worsening.  Please establish with a primary care provider for recheck and management of your symptoms.

## 2019-06-28 NOTE — ED Triage Notes (Signed)
Pt reports two days of abdominal discomfort.  He states he has not been able to sleep and he states his BM's have been hard and soft.  He states this has happened before.  He reports taking some liquid that starts with a "Z" with no relief.

## 2019-07-02 ENCOUNTER — Telehealth: Payer: Self-pay | Admitting: Registered Nurse

## 2019-07-02 DIAGNOSIS — Z1159 Encounter for screening for other viral diseases: Secondary | ICD-10-CM

## 2019-07-02 NOTE — Telephone Encounter (Signed)
Copied from [place] [id number]. Topic: General - Other >> Jul 01, 2019  4:52 PM Rainey Pines A wrote: Patient would like a callback from nurse to explain what type of bloodwork will be done at his appointment

## 2019-07-05 ENCOUNTER — Encounter: Payer: Self-pay | Admitting: Registered Nurse

## 2019-07-05 ENCOUNTER — Other Ambulatory Visit: Payer: Self-pay

## 2019-07-05 ENCOUNTER — Ambulatory Visit (INDEPENDENT_AMBULATORY_CARE_PROVIDER_SITE_OTHER): Payer: Self-pay | Admitting: Registered Nurse

## 2019-07-05 VITALS — BP 121/77 | HR 83 | Temp 98.6°F | Resp 16 | Ht 67.91 in | Wt 222.0 lb

## 2019-07-05 DIAGNOSIS — Z130 Encounter for screening for diseases of the blood and blood-forming organs and certain disorders involving the immune mechanism: Secondary | ICD-10-CM

## 2019-07-05 DIAGNOSIS — Z1329 Encounter for screening for other suspected endocrine disorder: Secondary | ICD-10-CM

## 2019-07-05 DIAGNOSIS — Z1159 Encounter for screening for other viral diseases: Secondary | ICD-10-CM

## 2019-07-05 DIAGNOSIS — Z13228 Encounter for screening for other metabolic disorders: Secondary | ICD-10-CM

## 2019-07-05 DIAGNOSIS — R1011 Right upper quadrant pain: Secondary | ICD-10-CM

## 2019-07-05 DIAGNOSIS — Z13220 Encounter for screening for lipoid disorders: Secondary | ICD-10-CM

## 2019-07-05 NOTE — Patient Instructions (Signed)
° ° ° °  If you have lab work done today you will be contacted with your lab results within the next 2 weeks.  If you have not heard from us then please contact us. The fastest way to get your results is to register for My Chart. ° ° °IF you received an x-ray today, you will receive an invoice from [facility]. Please contact [facility] at [phone] with questions or concerns regarding your invoice.  ° °IF you received labwork today, you will receive an invoice from [facility]. Please contact [facility] at [phone] with questions or concerns regarding your invoice.  ° °Our billing staff will not be able to assist you with questions regarding bills from these companies. ° °You will be contacted with the lab results as soon as they are available. The fastest way to get your results is to activate your My Chart account. Instructions are located on the last page of this paperwork. If you have not heard from us regarding the results in 2 weeks, please contact this office. °  ° ° ° °

## 2019-07-05 NOTE — Progress Notes (Signed)
Established Patient Office Visit  Subjective:  Patient ID: Steven Kidd, male    DOB: 18-Jan-1986  Age: 33 y.o. MRN: [id number]  CC:  Chief Complaint  Patient presents with  . Establish Care    HPI [name] presents for urgent care follow up.  Seen in urgent care one week prior to visit for severe RUQ pain. He states that he felt fatigued, was producing minimal amounts of urine, and was not passing stool.   He had labs drawn at Urgent care that showed normal lipase, CBC with elevated RBC and Hemoglobin, and CMP with elevated LFTs.  He states that since that visit he has been active daily and drinking plenty of water. He has noted that his pain has decreased significantly.   No known history of Hepatitis. No change in stool color. No blood in stool or melena. Started omeprazole with good effect.   History reviewed. No pertinent past medical history.  History reviewed. No pertinent surgical history.  History reviewed. No pertinent family history.  Social History   Socioeconomic History  . Marital status: Married    Spouse name: Not on file  . Number of children: Not on file  . Years of education: Not on file  . Highest education level: Not on file  Occupational History  . Not on file  Social Needs  . Financial resource strain: Not on file  . Food insecurity    Worry: Not on file    Inability: Not on file  . Transportation needs    Medical: Not on file    Non-medical: Not on file  Tobacco Use  . Smoking status: Never Smoker  . Smokeless tobacco: Never Used  Substance and Sexual Activity  . Alcohol use: Yes    Comment: Socailly   . Drug use: No  . Sexual activity: Not on file  Lifestyle  . Physical activity    Days per week: Not on file    Minutes per session: Not on file  . Stress: Not on file  Relationships  . Social Herbalist on phone: Not on file    Gets together: Not on file    Attends religious service: Not on file    Active member of  club or organization: Not on file    Attends meetings of clubs or organizations: Not on file    Relationship status: Not on file  . Intimate partner violence    Fear of current or ex partner: Not on file    Emotionally abused: Not on file    Physically abused: Not on file    Forced sexual activity: Not on file  Other Topics Concern  . Not on file  Social History Narrative  . Not on file    Outpatient Medications Prior to Visit  Medication Sig Dispense Refill  . diclofenac (VOLTAREN) 75 MG EC tablet Take 1 tablet (75 mg total) by mouth 2 (two) times daily. 14 tablet 0  . omeprazole (PRILOSEC) 20 MG capsule Take 1 capsule (20 mg total) by mouth daily. 30 capsule 0  . polyethylene glycol powder (GLYCOLAX/MIRALAX) 17 GM/SCOOP powder Take 17 g by mouth daily. 255 g 0   No facility-administered medications prior to visit.     No Known Allergies  ROS Review of Systems  Constitutional: Negative.   HENT: Negative.   Eyes: Negative.   Respiratory: Negative.   Cardiovascular: Negative.   Gastrointestinal: Positive for abdominal pain. Negative for abdominal distention, anal bleeding, blood in  stool, constipation, diarrhea, nausea, rectal pain and vomiting.  Endocrine: Negative.   Genitourinary: Negative.   Musculoskeletal: Negative.   Skin: Negative.   Allergic/Immunologic: Negative.   Neurological: Negative.   Hematological: Negative.   Psychiatric/Behavioral: Negative.   All other systems reviewed and are negative.     Objective:    Physical Exam  Constitutional: He appears well-developed and well-nourished. No distress.  HENT:  Head: Normocephalic and atraumatic.  Right Ear: External ear normal.  Left Ear: External ear normal.  Cardiovascular: Normal rate and regular rhythm.  Pulmonary/Chest: No respiratory distress.  Abdominal: Soft. Bowel sounds are normal. There is abdominal tenderness (RUQ midsternal line along bottom edge of ribs).  Neurological: He is alert.   Skin: Skin is warm and dry. No rash noted. He is not diaphoretic. No erythema. No pallor.  Psychiatric: He has a normal mood and affect. His behavior is normal. Judgment and thought content normal.  Nursing note and vitals reviewed.   BP 121/77   Pulse 83   Temp 98.6 F (37 C) (Oral)   Resp 16   Ht 5' 7.91" (1.725 m)   Wt 222 lb (100.7 kg)   SpO2 97%   BMI 33.84 kg/m  Wt Readings from Last 3 Encounters:  07/05/19 222 lb (100.7 kg)  12/28/14 193 lb (87.5 kg)     Health Maintenance Due  Topic Date Due  . HIV Screening  05/21/2000    There are no preventive care reminders to display for this patient.  No results found for: TSH Lab Results  Component Value Date   WBC 4.4 06/28/2019   HGB 17.3 (H) 06/28/2019   HCT 48.1 06/28/2019   MCV 80.8 06/28/2019   PLT 180 06/28/2019   Lab Results  Component Value Date   NA 136 06/28/2019   K 4.3 06/28/2019   CO2 24 06/28/2019   GLUCOSE 119 (H) 06/28/2019   BUN 6 06/28/2019   CREATININE 1.08 06/28/2019   BILITOT 1.5 (H) 06/28/2019   ALKPHOS 78 06/28/2019   AST 212 (H) 06/28/2019   ALT 180 (H) 06/28/2019   PROT 8.1 06/28/2019   ALBUMIN 4.6 06/28/2019   CALCIUM 9.8 06/28/2019   ANIONGAP 12 06/28/2019   No results found for: CHOL No results found for: HDL No results found for: LDLCALC No results found for: TRIG No results found for: CHOLHDL No results found for: XLKG4WHGBA1C    Assessment & Plan:   Problem List Items Addressed This Visit    None    Visit Diagnoses    Screening for endocrine, metabolic and immunity disorder    -  Primary   Relevant Orders   CBC with Differential/Platelet   Comprehensive metabolic panel   TSH   Hemoglobin A1c   Lipid screening       Relevant Orders   Lipid panel   RUQ pain       Screening for viral disease       Relevant Orders   HepB+HepC+HIV Panel      No orders of the defined types were placed in this encounter.   Follow-up: No follow-ups on file.   PLAN  Labs  ordered: CMP, CBC, TSH, A1c, Hepatitis panel. Will follow up as warranted.   Reviewed ED precautions, if cholelithiases or nephrolithiases.  Feel that it is likely GERD vs constipation, recommended Psyllium Husk daily with plenty of water. Pt states he drinks in excess of one gallon of water daily.  Patient encouraged to call clinic with any questions,  comments, or concerns.    Janeece Ageeichard Daly Whipkey, NP

## 2019-07-06 LAB — CBC WITH DIFFERENTIAL/PLATELET
Basophils Absolute: 0 10*3/uL (ref 0.0–0.2)
Basos: 1 %
EOS (ABSOLUTE): 0.2 10*3/uL (ref 0.0–0.4)
Eos: 4 %
Hematocrit: 46.4 % (ref 37.5–51.0)
Hemoglobin: 16 g/dL (ref 13.0–17.7)
Immature Grans (Abs): 0 10*3/uL (ref 0.0–0.1)
Immature Granulocytes: 0 %
Lymphocytes Absolute: 1.9 10*3/uL (ref 0.7–3.1)
Lymphs: 43 %
MCH: 29.3 pg (ref 26.6–33.0)
MCHC: 34.5 g/dL (ref 31.5–35.7)
MCV: 85 fL (ref 79–97)
Monocytes Absolute: 0.3 10*3/uL (ref 0.1–0.9)
Monocytes: 8 %
Neutrophils Absolute: 1.9 10*3/uL (ref 1.4–7.0)
Neutrophils: 44 %
Platelets: 201 10*3/uL (ref 150–450)
RBC: 5.46 x10E6/uL (ref 4.14–5.80)
RDW: 15.1 % (ref 11.6–15.4)
WBC: 4.2 10*3/uL (ref 3.4–10.8)

## 2019-07-06 LAB — HIV ANTIBODY (ROUTINE TESTING W REFLEX): HIV Screen 4th Generation wRfx: NONREACTIVE

## 2019-07-06 LAB — HEPATITIS C ANTIBODY: Hep C Virus Ab: <0.1 s/co ratio (ref 0.0–0.9)

## 2019-07-06 LAB — HEPATITIS B SURFACE ANTIBODY, QUANTITATIVE: Hepatitis B Surf Ab Quant: <3.1 m[IU]/mL — ABNORMAL LOW (ref >9.9)

## 2019-07-06 LAB — COMPREHENSIVE METABOLIC PANEL
ALT: 131 IU/L — ABNORMAL HIGH (ref 0–44)
AST: 66 IU/L — ABNORMAL HIGH (ref 0–40)
Albumin/Globulin Ratio: 2 (ref 1.2–2.2)
Albumin: 5 g/dL (ref 4.0–5.0)
Alkaline Phosphatase: 85 IU/L (ref 39–117)
BUN/Creatinine Ratio: 7 — ABNORMAL LOW (ref 9–20)
BUN: 7 mg/dL (ref 6–20)
Bilirubin Total: 0.6 mg/dL (ref 0.0–1.2)
CO2: 19 mmol/L — ABNORMAL LOW (ref 20–29)
Calcium: 9.6 mg/dL (ref 8.7–10.2)
Chloride: 104 mmol/L (ref 96–106)
Creatinine, Ser: 1.03 mg/dL (ref 0.76–1.27)
GFR calc Af Amer: 109 mL/min/{1.73_m2} (ref >59)
GFR calc non Af Amer: 94 mL/min/{1.73_m2} (ref >59)
Globulin, Total: 2.5 g/dL (ref 1.5–4.5)
Glucose: 92 mg/dL (ref 65–99)
Potassium: 4.5 mmol/L (ref 3.5–5.2)
Sodium: 141 mmol/L (ref 134–144)
Total Protein: 7.5 g/dL (ref 6.0–8.5)

## 2019-07-06 LAB — HEMOGLOBIN A1C
Est. average glucose Bld gHb Est-mCnc: 97 mg/dL
Hgb A1c MFr Bld: 5 % (ref 4.8–5.6)

## 2019-07-06 LAB — LIPID PANEL
Chol/HDL Ratio: 4.5 ratio (ref 0.0–5.0)
Cholesterol, Total: 228 mg/dL — ABNORMAL HIGH (ref 100–199)
HDL: 51 mg/dL (ref >39)
LDL Calculated: 159 mg/dL — ABNORMAL HIGH (ref 0–99)
Triglycerides: 90 mg/dL (ref 0–149)
VLDL Cholesterol Cal: 18 mg/dL (ref 5–40)

## 2019-07-06 LAB — TSH: TSH: 1.6 u[IU]/mL (ref 0.450–4.500)

## 2019-07-06 NOTE — Telephone Encounter (Signed)
Called patient to discuss results. Liver enzymes decreasing well, but still not wnl Will recheck in 2 weeks, also check Hep B Antigen at that time  Kathrin Ruddy, NP

## 2019-07-20 ENCOUNTER — Ambulatory Visit: Payer: Self-pay | Admitting: Registered Nurse

## 2019-07-20 ENCOUNTER — Other Ambulatory Visit: Payer: Self-pay

## 2019-07-20 DIAGNOSIS — Z1159 Encounter for screening for other viral diseases: Secondary | ICD-10-CM

## 2019-07-21 LAB — HEPATIC FUNCTION PANEL
ALT: 90 IU/L — ABNORMAL HIGH (ref 0–44)
AST: 65 IU/L — ABNORMAL HIGH (ref 0–40)
Albumin: 4.9 g/dL (ref 4.0–5.0)
Alkaline Phosphatase: 89 IU/L (ref 39–117)
Bilirubin Total: 0.4 mg/dL (ref 0.0–1.2)
Bilirubin, Direct: 0.17 mg/dL (ref 0.00–0.40)
Total Protein: 7.3 g/dL (ref 6.0–8.5)

## 2019-07-21 LAB — HEPATITIS B E ANTIGEN: Hep B E Ag: NEGATIVE

## 2019-07-21 NOTE — Progress Notes (Signed)
Overall LFTs continue to trend towards normal limits. So long as pain continues to resolve and future lab shows LFTs continue to trend towards normal limits, concern is limited at this time after normal findings on exam.  Kathrin Ruddy, NP

## 2020-05-13 ENCOUNTER — Ambulatory Visit (INDEPENDENT_AMBULATORY_CARE_PROVIDER_SITE_OTHER): Payer: Self-pay

## 2020-05-13 ENCOUNTER — Ambulatory Visit (HOSPITAL_COMMUNITY)
Admission: EM | Admit: 2020-05-13 | Discharge: 2020-05-13 | Disposition: A | Discharge status: Home / Self Care | Payer: Self-pay

## 2020-05-13 ENCOUNTER — Encounter (HOSPITAL_COMMUNITY): Payer: Self-pay

## 2020-05-13 ENCOUNTER — Other Ambulatory Visit: Payer: Self-pay

## 2020-05-13 DIAGNOSIS — S93602A Unspecified sprain of left foot, initial encounter: Secondary | ICD-10-CM

## 2020-05-13 MED ORDER — IBUPROFEN 800 MG PO TABS
800.0000 mg | ORAL_TABLET | Freq: Three times a day (TID) | ORAL | 0 refills | Status: DC
Start: 2020-05-13 — End: 2020-06-12

## 2020-05-13 NOTE — ED Provider Notes (Signed)
[facility]    CSN: [id number] Arrival date & time: 05/13/20  1003      History   Chief Complaint Chief Complaint  Patient presents with  . Foot Injury    HPI Steven Kidd is a 34 y.o. male.   Patient presents for evaluation of left foot and big toe pain.  He reports over a week ago he dropped a dresser on the left foot.  He reports it was painful and swollen following this.  He reports he iced it and took ibuprofen.  Reports it was not bothering him much until last night when he noted a lot of pain and swelling develop.  Reports it hurt hurts to touch it through the night.  No fevers no chills.  Denies any wounds around the area.  No history of gout or inflammatory arthritis.     History reviewed. No pertinent past medical history.  There are no problems to display for this patient.   History reviewed. No pertinent surgical history.     Home Medications    Prior to Admission medications   Medication Sig Start Date End Date Taking? Authorizing Provider  diclofenac (VOLTAREN) 75 MG EC tablet Take 1 tablet (75 mg total) by mouth 2 (two) times daily. 10/08/18   Vanessa Kick, MD  ibuprofen (ADVIL) 800 MG tablet Take 1 tablet (800 mg total) by mouth 3 (three) times daily. 05/13/20   Ajane Novella, Marguerita Beards, PA-C  omeprazole (PRILOSEC) 20 MG capsule Take 1 capsule (20 mg total) by mouth daily. 06/28/19   Zigmund Gottron, NP    Family History History reviewed. No pertinent family history.  Social History Social History   Tobacco Use  . Smoking status: Never Smoker  . Smokeless tobacco: Never Used  Substance Use Topics  . Alcohol use: Yes    Comment: Socailly   . Drug use: No     Allergies   Patient has no known allergies.   Review of Systems Review of Systems   Physical Exam Triage Vital Signs ED Triage Vitals  Enc Vitals Group     BP 05/13/20 1026 (!) 134/98     Pulse Rate 05/13/20 1026 79     Resp 05/13/20 1026 18     Temp 05/13/20 1026 98.3 F  (36.8 C)     Temp Source 05/13/20 1026 Oral     SpO2 05/13/20 1026 98 %     Weight --      Height --      Head Circumference --      Peak Flow --      Pain Score 05/13/20 1025 8     Pain Loc --      Pain Edu? --      Excl. in [place]? --    No data found.  Updated Vital Signs BP (!) 134/98 (BP Location: Left Arm)   Pulse 79   Temp 98.3 F (36.8 C) (Oral)   Resp 18   SpO2 98%   Visual Acuity Right Eye Distance:   Left Eye Distance:   Bilateral Distance:    Right Eye Near:   Left Eye Near:    Bilateral Near:     Physical Exam Vitals and nursing note reviewed.  Constitutional:      Appearance: Normal appearance.  Musculoskeletal:     Comments: Left foot with swelling.  Tenderness to palpation over the base of the first digit.  Pain with passive range of motion.  There is also tenderness over  the second through fourth MTP joints.  Cap refill less than 2 seconds.  Sensation intact.  Patient has range of motion however this is limited by pain.  Skin:    Capillary Refill: Capillary refill takes less than 2 seconds.  Neurological:     Mental Status: He is alert.      UC Treatments / Results  Labs (all labs ordered are listed, but only abnormal results are displayed) Labs Reviewed - No data to display  EKG   Radiology DG Foot Complete Left  Result Date: 05/13/2020 CLINICAL DATA:  Dropped dresser on first toe with pain, initial encounter EXAM: LEFT FOOT - COMPLETE 3+ VIEW COMPARISON:  None. FINDINGS: Mild degenerative changes of the first MTP joint are noted. Mild soft tissue swelling is seen. No radiopaque foreign body is noted. No acute fracture is seen. IMPRESSION: Mild degenerative change without acute bony abnormality. Mild soft tissue swelling is noted. Electronically Signed   By: Alcide Clever M.D.   On: 05/13/2020 11:31    Procedures Procedures (including critical care time)  Medications Ordered in UC Medications - No data to display  Initial Impression /  Assessment and Plan / UC Course  I have reviewed the triage vital signs and the nursing notes.  Pertinent labs & imaging results that were available during my care of the patient were reviewed by me and considered in my medical decision making (see chart for details).     #Left foot sprain Patient is a 34 year old with what appears to be a left foot sprain.  Case was discussed with sports medicine attending on shift, x-ray exam and concern for ligamentous injury in the foot.  At this time I doubt infectious.  Gout is unlikely given no history and age.  Will place on NSAIDs and have him follow-up with sports medicine for evaluation early next week.  Will make him nonweightbearing.  Return precautions for fever were discussed.  Patient verbalized understanding plan. Final Clinical Impressions(s) / UC Diagnoses   Final diagnoses:  Sprain of left foot, initial encounter     Discharge Instructions     Take the ibuprofen 3 times a day for 4 days, then as needed  Call  the sports medicine group Monday morning for Evaluation early next week  Use the crutches at all times and do not bear weight on the foot.  If you were to develop fever, please return for re-evaluation      ED Prescriptions    Medication Sig Dispense Auth. Provider   ibuprofen (ADVIL) 800 MG tablet Take 1 tablet (800 mg total) by mouth 3 (three) times daily. 21 tablet Mattilyn Crites, Veryl Speak, PA-C     PDMP not reviewed this encounter.   Hermelinda Medicus, PA-C 05/13/20 1900

## 2020-05-13 NOTE — ED Triage Notes (Signed)
Pt reports 5 days ago a dresser fell over his left foot, and he used ice and ibuprofen and was able to work, but since yesterday the left foot is swelling and painful.

## 2020-05-13 NOTE — Discharge Instructions (Signed)
Take the ibuprofen 3 times a day for 4 days, then as needed  Call  the sports medicine group Monday morning for Evaluation early next week  Use the crutches at all times and do not bear weight on the foot.  If you were to develop fever, please return for re-evaluation

## 2020-06-12 ENCOUNTER — Other Ambulatory Visit: Payer: Self-pay

## 2020-06-12 ENCOUNTER — Ambulatory Visit (HOSPITAL_COMMUNITY)
Admission: EM | Admit: 2020-06-12 | Discharge: 2020-06-12 | Disposition: A | Discharge status: Home / Self Care | Payer: Self-pay | Attending: Family Medicine | Admitting: Family Medicine

## 2020-06-12 ENCOUNTER — Encounter (HOSPITAL_COMMUNITY): Payer: Self-pay

## 2020-06-12 DIAGNOSIS — T161XXA Foreign body in right ear, initial encounter: Secondary | ICD-10-CM

## 2020-06-12 MED ORDER — NEOMYCIN-POLYMYXIN-HC 3.5-10000-1 OT SUSP
4.0000 [drp] | Freq: Four times a day (QID) | OTIC | 0 refills | Status: DC
Start: 2020-06-12 — End: 2021-08-21

## 2020-06-12 MED ORDER — IBUPROFEN 800 MG PO TABS
800.0000 mg | ORAL_TABLET | Freq: Three times a day (TID) | ORAL | 0 refills | Status: DC
Start: 2020-06-12 — End: 2024-04-16

## 2020-06-12 NOTE — ED Triage Notes (Signed)
First attempt to call patient back for triage. No answer

## 2020-06-12 NOTE — ED Triage Notes (Signed)
Pt presents with piece of blue tooth stuck in right ear X 2 days

## 2020-06-12 NOTE — Discharge Instructions (Signed)
Use anti-inflammatories for pain/swelling. You may take up to 800 mg Ibuprofen every 8 hours with food. You may supplement Ibuprofen with Tylenol [phone] mg every 8 hours.   Return if developing increased pain, swelling or drainage, decreased hearing

## 2020-06-13 DIAGNOSIS — T161XXA Foreign body in right ear, initial encounter: Secondary | ICD-10-CM

## 2020-06-13 NOTE — ED Provider Notes (Signed)
[facility]    CSN: [id number] Arrival date & time: 06/12/20  1805      History   Chief Complaint Chief Complaint  Patient presents with  . Bluetooth Stuck in Ear    HPI Steven Kidd is a 34 y.o. male presenting today for evaluation of retained earbud in his right ear.  Patient reports that part of his headphones got stuck in his right ear and has been present for approximately 3 days.  He denies significant pain associated with this.  He has not attempted to retrieve it by sticking anything in his ear.  HPI  History reviewed. No pertinent past medical history.  There are no problems to display for this patient.   History reviewed. No pertinent surgical history.     Home Medications    Prior to Admission medications   Medication Sig Start Date End Date Taking? Authorizing Provider  diclofenac (VOLTAREN) 75 MG EC tablet Take 1 tablet (75 mg total) by mouth 2 (two) times daily. 10/08/18   Mardella Layman, MD  ibuprofen (ADVIL) 800 MG tablet Take 1 tablet (800 mg total) by mouth 3 (three) times daily. 06/12/20   Jeromiah Ohalloran C, PA-C  neomycin-polymyxin-hydrocortisone (CORTISPORIN) 3.5-10000-1 OTIC suspension Place 4 drops into the right ear 4 (four) times daily. 06/12/20   Deonte Otting C, PA-C  omeprazole (PRILOSEC) 20 MG capsule Take 1 capsule (20 mg total) by mouth daily. 06/28/19   Georgetta Haber, NP    Family History Family History  Family history unknown: Yes    Social History Social History   Tobacco Use  . Smoking status: Never Smoker  . Smokeless tobacco: Never Used  Substance Use Topics  . Alcohol use: Yes    Comment: Socailly   . Drug use: No     Allergies   Patient has no known allergies.   Review of Systems Review of Systems  Constitutional: Negative for activity change, appetite change, chills, fatigue and fever.  HENT: Negative for congestion, ear pain, rhinorrhea, sinus pressure, sore throat and trouble swallowing.   Eyes:  Negative for discharge and redness.  Respiratory: Negative for cough, chest tightness and shortness of breath.   Cardiovascular: Negative for chest pain.  Gastrointestinal: Negative for abdominal pain, diarrhea, nausea and vomiting.  Musculoskeletal: Negative for myalgias.  Skin: Negative for rash.  Neurological: Negative for dizziness, light-headedness and headaches.     Physical Exam Triage Vital Signs ED Triage Vitals  Enc Vitals Group     BP 06/12/20 2009 115/84     Pulse Rate 06/12/20 2009 63     Resp 06/12/20 2009 16     Temp 06/12/20 2009 98.2 F (36.8 C)     Temp Source 06/12/20 2009 Oral     SpO2 06/12/20 2009 100 %     Weight --      Height --      Head Circumference --      Peak Flow --      Pain Score 06/12/20 2014 4     Pain Loc --      Pain Edu? --      Excl. in [place]? --    No data found.  Updated Vital Signs BP 115/84 (BP Location: Left Arm)   Pulse 63   Temp 98.2 F (36.8 C) (Oral)   Resp 16   SpO2 100%   Visual Acuity Right Eye Distance:   Left Eye Distance:   Bilateral Distance:    Right Eye Near:  Left Eye Near:    Bilateral Near:     Physical Exam Vitals and nursing note reviewed.  Constitutional:      Appearance: He is well-developed.     Comments: No acute distress  HENT:     Head: Normocephalic and atraumatic.     Ears:     Comments: Right canal with rubber earbud present  After removal canal appears slightly erythematous, TM intact with good bony landmarks and nonerythematous    Nose: Nose normal.  Eyes:     Conjunctiva/sclera: Conjunctivae normal.  Cardiovascular:     Rate and Rhythm: Normal rate.  Pulmonary:     Effort: Pulmonary effort is normal. No respiratory distress.  Abdominal:     General: There is no distension.  Musculoskeletal:        General: Normal range of motion.     Cervical back: Neck supple.  Skin:    General: Skin is warm and dry.  Neurological:     Mental Status: He is alert and oriented to person,  place, and time.      UC Treatments / Results  Labs (all labs ordered are listed, but only abnormal results are displayed) Labs Reviewed - No data to display  EKG   Radiology No results found.  Procedures Foreign Body Removal  Date/Time: 06/13/2020 11:28 AM Performed by: Zuleyka Kloc, Junius Creamer, PA-C Authorized by: Mardella Layman, MD   Consent:    Consent obtained:  Verbal   Consent given by:  Patient   Risks discussed:  Pain and incomplete removal   Alternatives discussed:  No treatment Location:    Location:  Ear   Ear location:  R ear Pre-procedure details:    Imaging:  None Anesthesia (see MAR for exact dosages):    Anesthesia method:  None Procedure type:    Procedure complexity:  Simple Procedure details:    Localization method:  Visualized   Bloodless field: yes     Removal mechanism:  Forceps   Foreign bodies recovered:  1   Description:  1 rubber ear bud tip   Intact foreign body removal: yes   Post-procedure details:    Neurovascular status: intact     Confirmation:  No additional foreign bodies on visualization   Skin closure:  None   Patient tolerance of procedure:  Tolerated well, no immediate complications   (including critical care time)  Medications Ordered in UC Medications - No data to display  Initial Impression / Assessment and Plan / UC Course  I have reviewed the triage vital signs and the nursing notes.  Pertinent labs & imaging results that were available during my care of the patient were reviewed by me and considered in my medical decision making (see chart for details).     Foreign body removed completely, canal appears erythematous, recommend Tylenol and ibuprofen for pain.  May use Cortisporin if developing signs of otitis externa.  Discussed strict return precautions. Patient verbalized understanding and is agreeable with plan.  Final Clinical Impressions(s) / UC Diagnoses   Final diagnoses:  Foreign body of right ear, initial  encounter     Discharge Instructions     Use anti-inflammatories for pain/swelling. You may take up to 800 mg Ibuprofen every 8 hours with food. You may supplement Ibuprofen with Tylenol [phone] mg every 8 hours.   Return if developing increased pain, swelling or drainage, decreased hearing   ED Prescriptions    Medication Sig Dispense Auth. Provider   ibuprofen (ADVIL) 800 MG tablet  Take 1 tablet (800 mg total) by mouth 3 (three) times daily. 21 tablet Jazzlynn Rawe C, PA-C   neomycin-polymyxin-hydrocortisone (CORTISPORIN) 3.5-10000-1 OTIC suspension Place 4 drops into the right ear 4 (four) times daily. 10 mL Latrena Benegas, [place] C, PA-C     PDMP not reviewed this encounter.   Janith Lima, [place] 06/13/20 1129

## 2021-07-02 ENCOUNTER — Ambulatory Visit (HOSPITAL_COMMUNITY)
Admission: EM | Admit: 2021-07-02 | Discharge: 2021-07-02 | Disposition: A | Discharge status: Home / Self Care | Payer: Self-pay | Attending: Student | Admitting: Student

## 2021-07-02 ENCOUNTER — Encounter (HOSPITAL_COMMUNITY): Payer: Self-pay

## 2021-07-02 ENCOUNTER — Other Ambulatory Visit: Payer: Self-pay

## 2021-07-02 DIAGNOSIS — Z0189 Encounter for other specified special examinations: Secondary | ICD-10-CM | POA: Insufficient documentation

## 2021-07-02 DIAGNOSIS — R531 Weakness: Secondary | ICD-10-CM | POA: Insufficient documentation

## 2021-07-02 LAB — CBG MONITORING, ED: Glucose-Capillary: 82 mg/dL (ref 70–99)

## 2021-07-02 LAB — CBC
HCT: 43.7 % (ref 39.0–52.0)
Hemoglobin: 14.8 g/dL (ref 13.0–17.0)
MCH: 29.5 pg (ref 26.0–34.0)
MCHC: 33.9 g/dL (ref 30.0–36.0)
MCV: 87.1 fL (ref 80.0–100.0)
Platelets: 180 10*3/uL (ref 150–400)
RBC: 5.02 MIL/uL (ref 4.22–5.81)
RDW: 13.1 % (ref 11.5–15.5)
WBC: 3.8 10*3/uL — ABNORMAL LOW (ref 4.0–10.5)
nRBC: 0 % (ref 0.0–0.2)

## 2021-07-02 LAB — COMPREHENSIVE METABOLIC PANEL
ALT: 92 U/L — ABNORMAL HIGH (ref 0–44)
AST: 142 U/L — ABNORMAL HIGH (ref 15–41)
Albumin: 4.3 g/dL (ref 3.5–5.0)
Alkaline Phosphatase: 50 U/L (ref 38–126)
Anion gap: 10 (ref 5–15)
BUN: 7 mg/dL (ref 6–20)
CO2: 26 mmol/L (ref 22–32)
Calcium: 8.8 mg/dL — ABNORMAL LOW (ref 8.9–10.3)
Chloride: 103 mmol/L (ref 98–111)
Creatinine, Ser: 0.73 mg/dL (ref 0.61–1.24)
GFR, Estimated: >60 mL/min (ref >60)
Glucose, Bld: 82 mg/dL (ref 70–99)
Potassium: 3.8 mmol/L (ref 3.5–5.1)
Sodium: 139 mmol/L (ref 135–145)
Total Bilirubin: 1 mg/dL (ref 0.3–1.2)
Total Protein: 7.1 g/dL (ref 6.5–8.1)

## 2021-07-02 NOTE — ED Triage Notes (Signed)
Pt reports playing soccer this weekend and states he has been feeling weak ever since.  Pt denies chest pain.

## 2021-07-02 NOTE — Discharge Instructions (Addendum)
-  Make sure to drink plenty of fluids. Pick up some gatorade to restore electrolytes. -We'll call you if your labwork is abnormal -Seek additional medical attention if symptoms worsen, like dizziness, weakness, new headaches, vision changes, chest pain.

## 2021-07-02 NOTE — ED Provider Notes (Signed)
[facility]    CSN: [id number] Arrival date & time: 07/02/21  1021      History   Chief Complaint Chief Complaint  Patient presents with   Weakness    HPI Steven Kidd is a 35 y.o. male presenting with generalized weakness since playing soccer this weekend in 100 degree heat. Medical history noncontributory. Initially with dizziness but this resolved after hours and drinking fluids. States his only symptoms for last 2 days is weakness.  Denies ear pain, hearing changes, dizziness, shortness of breath, headaches, vision changes, chest pain, fever/chills, cough.  HPI  No past medical history on file.  There are no problems to display for this patient.   History reviewed. No pertinent surgical history.     Home Medications    Prior to Admission medications   Medication Sig Start Date End Date Taking? Authorizing Provider  diclofenac (VOLTAREN) 75 MG EC tablet Take 1 tablet (75 mg total) by mouth 2 (two) times daily. 10/08/18   Mardella Layman, MD  ibuprofen (ADVIL) 800 MG tablet Take 1 tablet (800 mg total) by mouth 3 (three) times daily. 06/12/20   Wieters, Hallie C, PA-C  neomycin-polymyxin-hydrocortisone (CORTISPORIN) 3.5-10000-1 OTIC suspension Place 4 drops into the right ear 4 (four) times daily. 06/12/20   Wieters, Hallie C, PA-C  omeprazole (PRILOSEC) 20 MG capsule Take 1 capsule (20 mg total) by mouth daily. 06/28/19   Georgetta Haber, NP    Family History Family History  Family history unknown: Yes    Social History Social History   Tobacco Use   Smoking status: Never   Smokeless tobacco: Never  Substance Use Topics   Alcohol use: Yes    Comment: Socailly    Drug use: No     Allergies   Patient has no known allergies.   Review of Systems Review of Systems  Constitutional:  Negative for appetite change, chills, fatigue and fever.  HENT:  Negative for congestion, sinus pressure, sore throat, trouble swallowing and voice change.   Eyes:   Negative for photophobia, pain, discharge, redness, itching and visual disturbance.  Respiratory:  Negative for cough, chest tightness and shortness of breath.   Cardiovascular:  Negative for chest pain, palpitations and leg swelling.  Gastrointestinal:  Negative for abdominal pain, constipation, diarrhea, nausea and vomiting.  Genitourinary:  Negative for dysuria, flank pain, frequency and urgency.  Musculoskeletal:  Negative for back pain, gait problem, myalgias, neck pain and neck stiffness.  Neurological:  Positive for weakness. Negative for dizziness, tremors, seizures, syncope, facial asymmetry, speech difficulty, light-headedness, numbness and headaches.  Psychiatric/Behavioral:  Negative for agitation, decreased concentration, dysphoric mood, hallucinations and suicidal ideas. The patient is not nervous/anxious.   All other systems reviewed and are negative.   Physical Exam Triage Vital Signs ED Triage Vitals  Enc Vitals Group     BP 07/02/21 1220 130/86     Pulse Rate 07/02/21 1220 69     Resp 07/02/21 1220 20     Temp 07/02/21 1220 98.9 F (37.2 C)     Temp Source 07/02/21 1220 Oral     SpO2 07/02/21 1220 98 %     Weight --      Height --      Head Circumference --      Peak Flow --      Pain Score 07/02/21 1219 0     Pain Loc --      Pain Edu? --      Excl. in GC? --  No data found.  Updated Vital Signs BP 130/86 (BP Location: Right Arm)   Pulse 69   Temp 98.9 F (37.2 C) (Oral)   Resp 20   SpO2 98%   Visual Acuity Right Eye Distance:   Left Eye Distance:   Bilateral Distance:    Right Eye Near:   Left Eye Near:    Bilateral Near:     Physical Exam Vitals reviewed.  Constitutional:      General: He is not in acute distress.    Appearance: Normal appearance. He is not ill-appearing.  HENT:     Head: Normocephalic and atraumatic.  Eyes:     Extraocular Movements: Extraocular movements intact.     Pupils: Pupils are equal, round, and reactive to  light.  Cardiovascular:     Rate and Rhythm: Normal rate and regular rhythm.     Heart sounds: Normal heart sounds.  Pulmonary:     Effort: Pulmonary effort is normal.     Breath sounds: Normal breath sounds. No wheezing, rhonchi or rales.  Musculoskeletal:     Cervical back: Normal range of motion and neck supple. No rigidity.  Lymphadenopathy:     Cervical: No cervical adenopathy.  Skin:    Capillary Refill: Capillary refill takes less than 2 seconds.  Neurological:     General: No focal deficit present.     Mental Status: He is alert and oriented to person, place, and time. Mental status is at baseline.     Cranial Nerves: Cranial nerves are intact. No cranial nerve deficit or facial asymmetry.     Sensory: Sensation is intact. No sensory deficit.     Motor: Motor function is intact. No weakness.     Coordination: Coordination is intact. Coordination normal.     Gait: Gait is intact. Gait normal.     Comments: CN 2-12 intact. No weakness or numbness in UEs or LEs.negative rhomberg and pronator drift. Normal gait.  Psychiatric:        Mood and Affect: Mood normal.        Behavior: Behavior normal.        Thought Content: Thought content normal.        Judgment: Judgment normal.     UC Treatments / Results  Labs (all labs ordered are listed, but only abnormal results are displayed) Labs Reviewed  COMPREHENSIVE METABOLIC PANEL  CBC  CBG MONITORING, ED    EKG   Radiology No results found.  Procedures Procedures (including critical care time)  Medications Ordered in UC Medications - No data to display  Initial Impression / Assessment and Plan / UC Course  I have reviewed the triage vital signs and the nursing notes.  Pertinent labs & imaging results that were available during my care of the patient were reviewed by me and considered in my medical decision making (see chart for details).     This patient is a very pleasant 35 y.o. year old male presenting with  generalized weakness x3 days following exertion in summer heat. Reassuring exam.   EKG NSR, no prior EKG for comparison. Nonfasting CBG wnl. No recent labs. Will check a CBC, CMP.  Rec good hydration- gatorade and water.  Work note provided. Strict ED return precautions discussed. Patient verbalizes understanding and agreement.    Final Clinical Impressions(s) / UC Diagnoses   Final diagnoses:  Generalized weakness  Routine lab draw     Discharge Instructions      -Make sure to drink plenty of  fluids. Pick up some gatorade to restore electrolytes. -We'll call you if your labwork is abnormal -Seek additional medical attention if symptoms worsen, like dizziness, weakness, new headaches, vision changes, chest pain.     ED Prescriptions   None    PDMP not reviewed this encounter.   Rhys Martini, PA-C 07/02/21 1328

## 2021-08-21 ENCOUNTER — Ambulatory Visit (HOSPITAL_COMMUNITY)
Admission: EM | Admit: 2021-08-21 | Discharge: 2021-08-21 | Disposition: A | Discharge status: Home / Self Care | Payer: Self-pay | Attending: Physician Assistant | Admitting: Physician Assistant

## 2021-08-21 ENCOUNTER — Encounter (HOSPITAL_COMMUNITY): Payer: Self-pay

## 2021-08-21 ENCOUNTER — Other Ambulatory Visit: Payer: Self-pay

## 2021-08-21 DIAGNOSIS — R197 Diarrhea, unspecified: Secondary | ICD-10-CM

## 2021-08-21 DIAGNOSIS — Z20822 Contact with and (suspected) exposure to covid-19: Secondary | ICD-10-CM | POA: Insufficient documentation

## 2021-08-21 DIAGNOSIS — Z791 Long term (current) use of non-steroidal anti-inflammatories (NSAID): Secondary | ICD-10-CM | POA: Insufficient documentation

## 2021-08-21 LAB — SARS CORONAVIRUS 2 (TAT 6-24 HRS): SARS Coronavirus 2: NEGATIVE

## 2021-08-21 NOTE — ED Triage Notes (Signed)
Pt c/o diarrhea x1 this am. States at work felt dizziness and fatigue today. States will need a work note.

## 2021-08-21 NOTE — ED Provider Notes (Signed)
[facility]    CSN: [id number] Arrival date & time: 08/21/21  0808      History   Chief Complaint Chief Complaint  Patient presents with   Diarrhea    HPI Steven Kidd is a 35 y.o. male.   Patient here concerned with diarrhea x 1 day.  Denies f/c, n/v, hematochezia, melena.  He is taking milk of magnesia which isn't helping.     History reviewed. No pertinent past medical history.  There are no problems to display for this patient.   History reviewed. No pertinent surgical history.     Home Medications    Prior to Admission medications   Medication Sig Start Date End Date Taking? Authorizing Provider  ibuprofen (ADVIL) 800 MG tablet Take 1 tablet (800 mg total) by mouth 3 (three) times daily. 06/12/20   Wieters, Junius Creamer, PA-C    Family History Family History  Family history unknown: Yes    Social History Social History   Tobacco Use   Smoking status: Never   Smokeless tobacco: Never  Substance Use Topics   Alcohol use: Yes    Comment: Socailly    Drug use: No     Allergies   Patient has no known allergies.   Review of Systems Review of Systems  Constitutional:  Negative for chills, fatigue and fever.  HENT:  Negative for congestion, ear pain, nosebleeds, postnasal drip, rhinorrhea, sinus pressure, sinus pain and sore throat.   Eyes:  Negative for pain and redness.  Respiratory:  Negative for cough, shortness of breath and wheezing.   Gastrointestinal:  Positive for abdominal pain (epigastric) and diarrhea. Negative for nausea and vomiting.  Genitourinary:  Negative for dysuria, frequency, scrotal swelling, testicular pain and urgency.  Musculoskeletal:  Negative for arthralgias and myalgias.  Skin:  Negative for rash.  Neurological:  Negative for light-headedness and headaches.  Hematological:  Negative for adenopathy. Does not bruise/bleed easily.  Psychiatric/Behavioral:  Negative for confusion and sleep disturbance.     Physical  Exam Triage Vital Signs ED Triage Vitals  Enc Vitals Group     BP 08/21/21 0834 (!) 136/101     Pulse Rate 08/21/21 0834 79     Resp 08/21/21 0834 18     Temp 08/21/21 0834 98.4 F (36.9 C)     Temp Source 08/21/21 0834 Oral     SpO2 08/21/21 0834 97 %     Weight --      Height --      Head Circumference --      Peak Flow --      Pain Score 08/21/21 0835 0     Pain Loc --      Pain Edu? --      Excl. in GC? --    No data found.  Updated Vital Signs BP (!) 136/101 (BP Location: Left Arm)   Pulse 79   Temp 98.4 F (36.9 C) (Oral)   Resp 18   SpO2 97%   Visual Acuity Right Eye Distance:   Left Eye Distance:   Bilateral Distance:    Right Eye Near:   Left Eye Near:    Bilateral Near:     Physical Exam Vitals and nursing note reviewed.  Constitutional:      General: He is not in acute distress.    Appearance: Normal appearance. He is not ill-appearing.  HENT:     Head: Normocephalic and atraumatic.     Right Ear: Tympanic membrane and ear canal  normal.     Left Ear: Tympanic membrane and ear canal normal.     Nose: No congestion or rhinorrhea.     Mouth/Throat:     Pharynx: No oropharyngeal exudate or posterior oropharyngeal erythema.  Eyes:     General: No scleral icterus.    Extraocular Movements: Extraocular movements intact.     Conjunctiva/sclera: Conjunctivae normal.  Cardiovascular:     Rate and Rhythm: Normal rate and regular rhythm.     Heart sounds: No murmur heard. Pulmonary:     Effort: Pulmonary effort is normal. No respiratory distress.     Breath sounds: Normal breath sounds. No wheezing or rales.  Abdominal:     Tenderness: There is abdominal tenderness in the epigastric area. There is no right CVA tenderness, left CVA tenderness or guarding. Negative signs include Murphy's sign, Rovsing's sign, McBurney's sign and obturator sign.  Musculoskeletal:     Cervical back: Normal range of motion. No rigidity.  Skin:    Capillary Refill: Capillary  refill takes less than 2 seconds.     Coloration: Skin is not jaundiced.     Findings: No rash.  Neurological:     General: No focal deficit present.     Mental Status: He is alert and oriented to person, place, and time.     Motor: No weakness.     Gait: Gait normal.  Psychiatric:        Mood and Affect: Mood normal.        Behavior: Behavior normal.     UC Treatments / Results  Labs (all labs ordered are listed, but only abnormal results are displayed) Labs Reviewed  SARS CORONAVIRUS 2 (TAT 6-24 HRS)    EKG   Radiology No results found.  Procedures Procedures (including critical care time)  Medications Ordered in UC Medications - No data to display  Initial Impression / Assessment and Plan / UC Course  I have reviewed the triage vital signs and the nursing notes.  Pertinent labs & imaging results that were available during my care of the patient were reviewed by me and considered in my medical decision making (see chart for details).     Discontinue milk of magnesia - this will worsen diarrhea Drink plenty of clear liquids - Pedialyte, Gatorade, beef/chicken/vegetable broth Follow up if no improvement or worsening sx, 1 week, PRN Final Clinical Impressions(s) / UC Diagnoses   Final diagnoses:  Diarrhea, unspecified type     Discharge Instructions      Drink plenty of water or clear liquids such as Pedialyte, Gatorade Fiber foods.     ED Prescriptions   None    PDMP not reviewed this encounter.   Miklo, Aken, PA-C 08/21/21 [phone]

## 2021-08-21 NOTE — Discharge Instructions (Addendum)
Drink plenty of water or clear liquids such as Pedialyte, Gatorade Fiber foods.

## 2021-10-28 IMAGING — DX DG FOOT COMPLETE 3+V*L*
3 series · 3 of 3 positions shown · non-contrast
Comparison: None.

CLINICAL DATA: Dropped dresser on first toe with pain, initial
encounter

EXAM:
LEFT FOOT - COMPLETE 3+ VIEW

[foot ap]
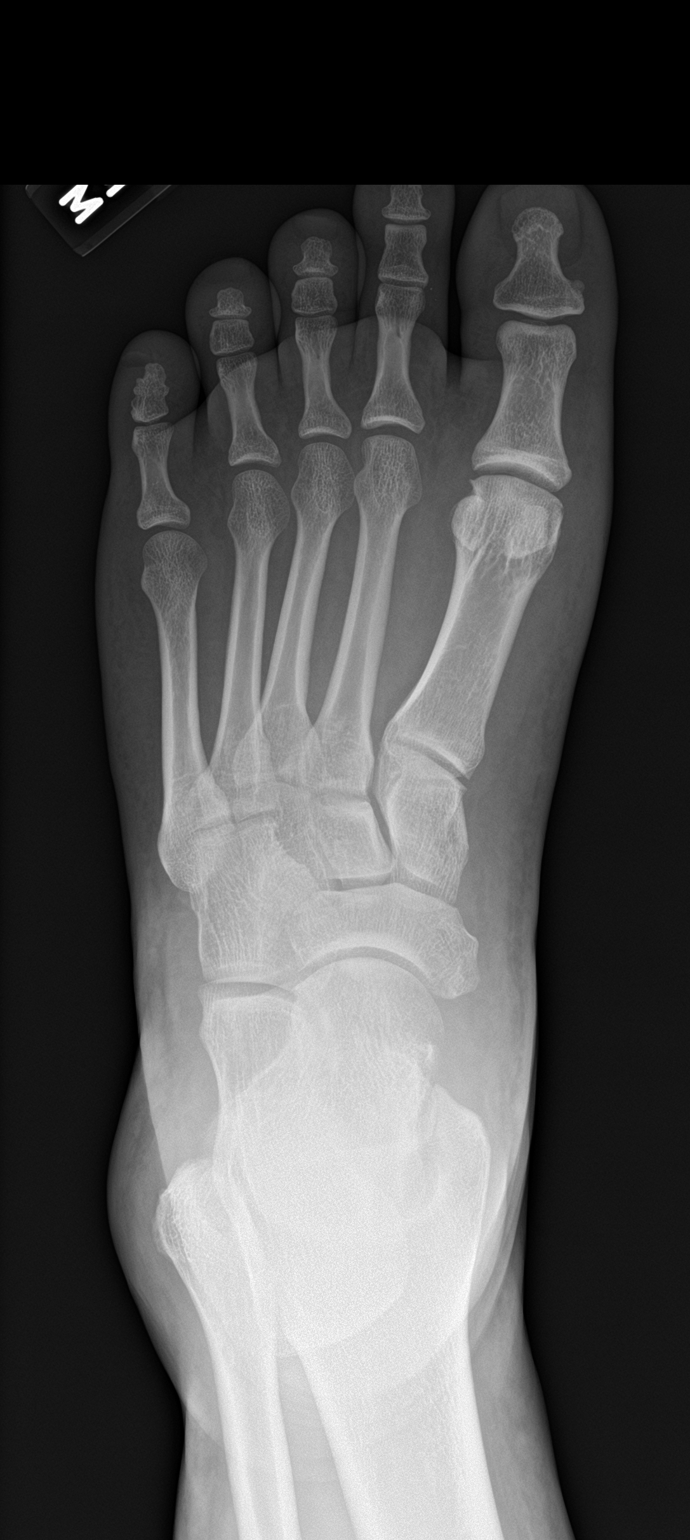

[foot obl]
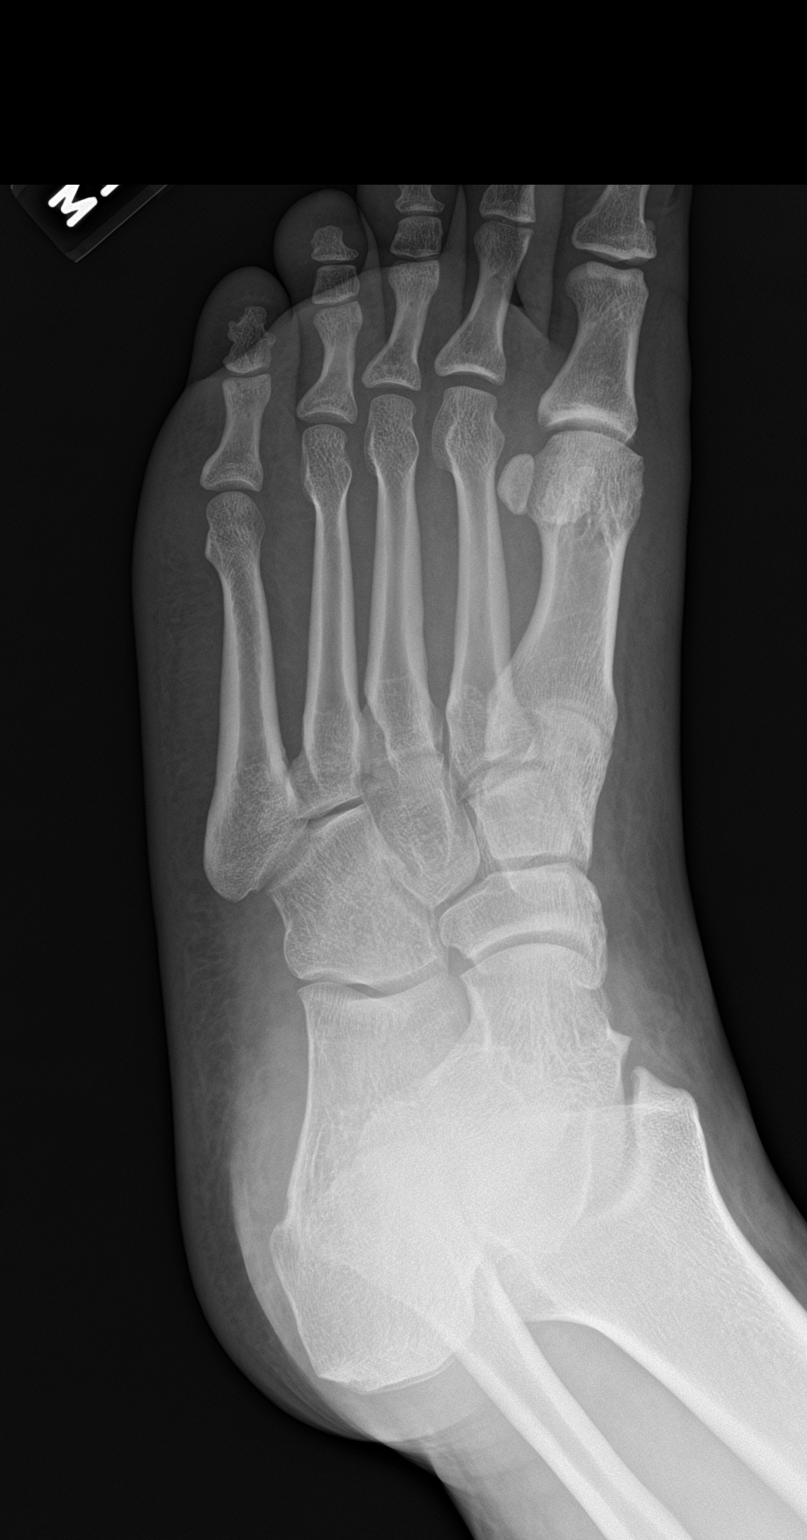

[foot lat]
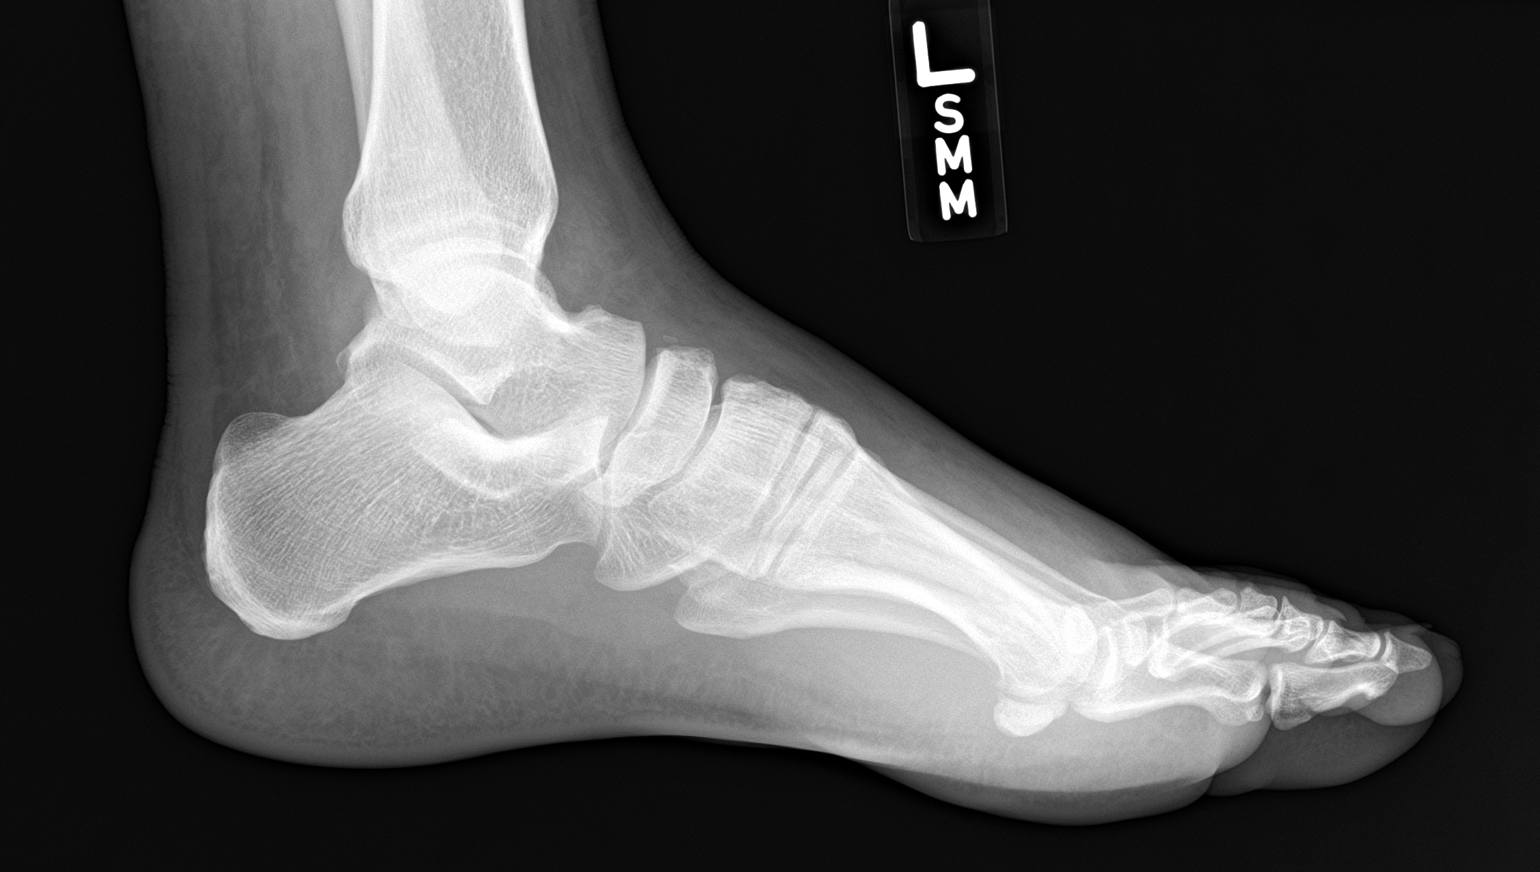

[3 of 3 positions shown; findings below may reference images not displayed]

FINDINGS: Mild degenerative changes of the first MTP joint are noted. Mild
soft tissue swelling is seen. No radiopaque foreign body is noted.
No acute fracture is seen.
IMPRESSION: Mild degenerative change without acute bony abnormality. Mild soft
tissue swelling is noted.

## 2022-01-28 ENCOUNTER — Other Ambulatory Visit: Payer: Self-pay

## 2022-01-28 ENCOUNTER — Ambulatory Visit (HOSPITAL_COMMUNITY)
Admission: EM | Admit: 2022-01-28 | Discharge: 2022-01-28 | Discharge status: Against Medical Advice | Payer: Self-pay | Attending: Family Medicine | Admitting: Family Medicine

## 2022-01-28 NOTE — ED Triage Notes (Signed)
Pt called in front lobby with no answer 

## 2022-01-28 NOTE — ED Notes (Signed)
Called pt in lobby 3 x no answer.   

## 2023-07-23 ENCOUNTER — Encounter (HOSPITAL_COMMUNITY): Payer: Self-pay

## 2023-07-23 ENCOUNTER — Ambulatory Visit (HOSPITAL_COMMUNITY)
Admission: EM | Admit: 2023-07-23 | Discharge: 2023-07-23 | Disposition: A | Discharge status: Home / Self Care | Payer: BC Managed Care – PPO

## 2023-07-23 DIAGNOSIS — M67432 Ganglion, left wrist: Secondary | ICD-10-CM

## 2023-07-23 DIAGNOSIS — H1133 Conjunctival hemorrhage, bilateral: Secondary | ICD-10-CM | POA: Diagnosis not present

## 2023-07-23 NOTE — ED Provider Notes (Signed)
[facility]    CSN: [id number] Arrival date & time: 07/23/23  1252      History   Chief Complaint Chief Complaint  Patient presents with   Eye Problem    HPI Steven Kidd is a 37 y.o. male.   Patient presents to clinic for bilateral eye redness.  Reports he recently mowed the yard without eye protection, he got a lot of debris in his eyes so he went to rinse out his eyes and felt better afterwards.  The next day he woke up and his eyes were red, the right was worse than the left.  He has been using over-the-counter Clear Eyes, reports it is not improving the redness in his eyes.  Denies any drainage or eye swelling.  Denies any vision changes or pain.  Patient also reports a cystic structure to the dorsum of his left wrist that has been present for the past few months. He denies pain. Thought it was a bug bite initially but was not itchy. Will grow and then become smaller after he messes with the area.   The history is provided by the patient and medical records.  Eye Problem Associated symptoms: redness   Associated symptoms: no discharge, no itching and no photophobia     History reviewed. No pertinent past medical history.  There are no problems to display for this patient.   History reviewed. No pertinent surgical history.     Home Medications    Prior to Admission medications   Medication Sig Start Date End Date Taking? Authorizing Provider  ibuprofen (ADVIL) 200 MG tablet Take 200 mg by mouth every 6 (six) hours as needed.   Yes [provider]  ibuprofen (ADVIL) 800 MG tablet Take 1 tablet (800 mg total) by mouth 3 (three) times daily. Patient not taking: Reported on 07/23/2023 06/12/20   Lew Dawes, PA-C    Family History Family History  Family history unknown: Yes    Social History Social History   Tobacco Use   Smoking status: Never   Smokeless tobacco: Never  Vaping Use   Vaping status: Never Used  Substance Use Topics    Alcohol use: Yes    Comment: Socially   Drug use: No     Allergies   Patient has no known allergies.   Review of Systems Review of Systems  Constitutional:  Negative for fever.  Eyes:  Positive for redness. Negative for photophobia, pain, discharge, itching and visual disturbance.  Skin:  Negative for wound.     Physical Exam Triage Vital Signs ED Triage Vitals  Encounter Vitals Group     BP 07/23/23 1445 122/85     Systolic BP Percentile --      Diastolic BP Percentile --      Pulse Rate 07/23/23 1445 71     Resp 07/23/23 1445 16     Temp 07/23/23 1445 98.2 F (36.8 C)     Temp Source 07/23/23 1445 Oral     SpO2 07/23/23 1445 97 %     Weight 07/23/23 1444 150 lb (68 kg)     Height 07/23/23 1444 5\' 9"  (1.753 m)     Head Circumference --      Peak Flow --      Pain Score 07/23/23 1444 6     Pain Loc --      Pain Education --      Exclude from Growth Chart --    No data found.  Updated  Vital Signs BP 122/85 (BP Location: Left Arm)   Pulse 71   Temp 98.2 F (36.8 C) (Oral)   Resp 16   Ht 5\' 9"  (1.753 m)   Wt 150 lb (68 kg)   SpO2 97%   BMI 22.15 kg/m   Visual Acuity Right Eye Distance:   Left Eye Distance:   Bilateral Distance:    Right Eye Near:   Left Eye Near:    Bilateral Near:     Physical Exam Vitals and nursing note reviewed.  Constitutional:      Appearance: Normal appearance.  HENT:     Head: Normocephalic and atraumatic.     Right Ear: External ear normal.     Left Ear: External ear normal.     Nose: Nose normal.     Mouth/Throat:     Mouth: Mucous membranes are moist.  Eyes:     General: Lids are everted, no foreign bodies appreciated. Vision grossly intact. Gaze aligned appropriately.     Extraocular Movements: Extraocular movements intact.     Conjunctiva/sclera:     Right eye: Hemorrhage present.     Pupils: Pupils are equal, round, and reactive to light.      Comments: Subconjunctival hemorrhages present bilaterally, no  discharge or eyelid swelling.  Vision grossly intact. PERRLA.   Cardiovascular:     Rate and Rhythm: Normal rate.     Pulses: Normal pulses.  Pulmonary:     Effort: Pulmonary effort is normal. No respiratory distress.  Musculoskeletal:        General: No swelling, tenderness, deformity or signs of injury. Normal range of motion.  Skin:    General: Skin is warm and dry.     Capillary Refill: Capillary refill takes less than 2 seconds.     Findings: Lesion present.          Comments: Round cystic structure that is mobile and nontender to the dorsum of the left wrist.  Neurological:     General: No focal deficit present.     Mental Status: He is alert and oriented to person, place, and time.  Psychiatric:        Mood and Affect: Mood normal.        Behavior: Behavior normal. Behavior is cooperative.      UC Treatments / Results  Labs (all labs ordered are listed, but only abnormal results are displayed) Labs Reviewed - No data to display  EKG   Radiology No results found.  Procedures Procedures (including critical care time)  Medications Ordered in UC Medications - No data to display  Initial Impression / Assessment and Plan / UC Course  I have reviewed the triage vital signs and the nursing notes.  Pertinent labs & imaging results that were available during my care of the patient were reviewed by me and considered in my medical decision making (see chart for details).  Vitals in triage reviewed, patient is hemodynamically stable.  Subconjunctival hemorrhage present to both eyes, encouraged to continue symptomatic management.  No vision changes or red flag symptoms. PERRLA.  Appears to have a ganglion cyst to the dorsum of the left wrist, advised orthopedic follow-up if symptoms persist.  Plan of care, follow-up care and return precautions given, no questions at this time.     Final Clinical Impressions(s) / UC Diagnoses   Final diagnoses:  Subconjunctival  hemorrhage of both eyes  Ganglion cyst of dorsum of left wrist     Discharge Instructions  Your bilateral eye redness is subconjunctival hemorrhages.  These should gradually resolve over the next few weeks.  Please refrain from heavily scratching her eyes and ensure you are wearing eye protection when indicated.  You appear to have a ganglion cyst on your wrist, this is a fluid-filled structure.  These typically resolve over a few months, if it becomes bothersome please follow-up with orthopedic you can do further imaging, drainage and potential surgical interventions.  For any pain you can take 800 mg of ibuprofen every 8 hours.  Please return to clinic for new or urgent symptoms.      ED Prescriptions   None    PDMP not reviewed this encounter.   Starlette Thurow, [place], [place] 07/23/23 1515

## 2023-07-23 NOTE — Discharge Instructions (Addendum)
Your bilateral eye redness is subconjunctival hemorrhages.  These should gradually resolve over the next few weeks.  Please refrain from heavily scratching her eyes and ensure you are wearing eye protection when indicated.  You appear to have a ganglion cyst on your wrist, this is a fluid-filled structure.  These typically resolve over a few months, if it becomes bothersome please follow-up with orthopedic you can do further imaging, drainage and potential surgical interventions.  For any pain you can take 800 mg of ibuprofen every 8 hours.  Please return to clinic for new or urgent symptoms.

## 2023-07-23 NOTE — ED Triage Notes (Signed)
Patient here today with c/o bilat eye pain and redness X 3 days. He tried using clear eyes OTC but not helping.   Patient also c/o mass on posterior left wrist X 3 days. Patient states that the bump comes and goes. No pain.

## 2023-09-06 ENCOUNTER — Encounter (HOSPITAL_COMMUNITY): Payer: Self-pay | Admitting: Emergency Medicine

## 2023-09-06 ENCOUNTER — Emergency Department (HOSPITAL_COMMUNITY): Payer: BC Managed Care – PPO

## 2023-09-06 ENCOUNTER — Emergency Department (HOSPITAL_COMMUNITY)
Admission: EM | Admit: 2023-09-06 | Discharge: 2023-09-06 | Disposition: A | Discharge status: Home / Self Care | Payer: BC Managed Care – PPO | Attending: Emergency Medicine | Admitting: Emergency Medicine

## 2023-09-06 ENCOUNTER — Other Ambulatory Visit: Payer: Self-pay

## 2023-09-06 DIAGNOSIS — R600 Localized edema: Secondary | ICD-10-CM | POA: Diagnosis not present

## 2023-09-06 DIAGNOSIS — R7401 Elevation of levels of liver transaminase levels: Secondary | ICD-10-CM | POA: Insufficient documentation

## 2023-09-06 DIAGNOSIS — M79672 Pain in left foot: Secondary | ICD-10-CM | POA: Diagnosis not present

## 2023-09-06 DIAGNOSIS — M7989 Other specified soft tissue disorders: Secondary | ICD-10-CM | POA: Diagnosis not present

## 2023-09-06 DIAGNOSIS — R2243 Localized swelling, mass and lump, lower limb, bilateral: Secondary | ICD-10-CM | POA: Diagnosis present

## 2023-09-06 DIAGNOSIS — M79671 Pain in right foot: Secondary | ICD-10-CM | POA: Diagnosis not present

## 2023-09-06 LAB — CBC
HCT: 41.6 % (ref 39.0–52.0)
Hemoglobin: 14.1 g/dL (ref 13.0–17.0)
MCH: 29 pg (ref 26.0–34.0)
MCHC: 33.9 g/dL (ref 30.0–36.0)
MCV: 85.6 fL (ref 80.0–100.0)
Platelets: 187 10*3/uL (ref 150–400)
RBC: 4.86 MIL/uL (ref 4.22–5.81)
RDW: 12 % (ref 11.5–15.5)
WBC: 6.1 10*3/uL (ref 4.0–10.5)
nRBC: 0 % (ref 0.0–0.2)

## 2023-09-06 LAB — BRAIN NATRIURETIC PEPTIDE: B Natriuretic Peptide: 27.6 pg/mL (ref 0.0–100.0)

## 2023-09-06 LAB — BASIC METABOLIC PANEL
Anion gap: 16 — ABNORMAL HIGH (ref 5–15)
BUN: 8 mg/dL (ref 6–20)
CO2: 28 mmol/L (ref 22–32)
Calcium: 10 mg/dL (ref 8.9–10.3)
Chloride: 97 mmol/L — ABNORMAL LOW (ref 98–111)
Creatinine, Ser: 0.75 mg/dL (ref 0.61–1.24)
GFR, Estimated: >60 mL/min (ref >60)
Glucose, Bld: 120 mg/dL — ABNORMAL HIGH (ref 70–99)
Potassium: 3.5 mmol/L (ref 3.5–5.1)
Sodium: 141 mmol/L (ref 135–145)

## 2023-09-06 LAB — HEPATIC FUNCTION PANEL
ALT: 89 U/L — ABNORMAL HIGH (ref 0–44)
AST: 91 U/L — ABNORMAL HIGH (ref 15–41)
Albumin: 4.2 g/dL (ref 3.5–5.0)
Alkaline Phosphatase: 59 U/L (ref 38–126)
Bilirubin, Direct: 0.1 mg/dL (ref 0.0–0.2)
Indirect Bilirubin: 0.5 mg/dL (ref 0.3–0.9)
Total Bilirubin: 0.6 mg/dL (ref 0.3–1.2)
Total Protein: 7.3 g/dL (ref 6.5–8.1)

## 2023-09-06 MED ORDER — IBUPROFEN 400 MG PO TABS
400.0000 mg | ORAL_TABLET | Freq: Once | ORAL | Status: AC
  Administered 2023-09-06: 400 mg via ORAL
  Filled 2023-09-06: qty 1

## 2023-09-06 NOTE — Discharge Instructions (Signed)
Please follow-up with your primary care provider regarding recent symptoms and ER visit.  Today your labs and imaging are reassuring however you do have elevated liver enzymes most likely related related to your alcohol use.  Please decrease your alcohol use to help your liver.  You may use ibuprofen every 6 hours needed for pain and use the compression stockings I prescribed for you to help with your swelling.  If symptoms change or worsen please return to ER.

## 2023-09-06 NOTE — ED Provider Notes (Signed)
[place] EMERGENCY DEPARTMENT AT [facility] Provider Note   CSN: [id number] Arrival date & time: 09/06/23  1635     History  Chief Complaint  Patient presents with   Foot Swelling    Steven Kidd is a 37 y.o. male with no pertinent past medical history presented with bilateral feet swelling.  Patient has any chest pain or shortness of breath or any liver dysfunction or kidney issues.  Patient states that the feet swelling been going on for the past 3 weeks.  Patient does drink alcohol regularly including 2 drinks on the weekends as been noted to have elevated liver enzymes in the past but denies any history of cirrhosis or portal hypertension.  Patient denies chest pain, shortness of breath, productive cough, orthopnea, skin color changes, fevers, area warm to the touch.  Patient does note that he is up and about walking all day as well.  Patient is not tried any medications at this time.  Patient denies recent travel/hospitalization/surgery, previous blood clots, personal history of cancer, estrogen use    Home Medications Prior to Admission medications   Medication Sig Start Date End Date Taking? Authorizing Provider  ibuprofen (ADVIL) 200 MG tablet Take 200 mg by mouth every 6 (six) hours as needed.    [provider]  ibuprofen (ADVIL) 800 MG tablet Take 1 tablet (800 mg total) by mouth 3 (three) times daily. Patient not taking: Reported on 07/23/2023 06/12/20   Lew Dawes, PA-C      Allergies    Patient has no known allergies.    Review of Systems   Review of Systems  Physical Exam Updated Vital Signs BP 125/85 (BP Location: Left Arm)   Pulse 90   Temp 98.2 F (36.8 C)   Resp 18   SpO2 100%  Physical Exam Constitutional:      General: He is not in acute distress.    Comments: Resting comfortably on his phone watching YouTube  Cardiovascular:     Rate and Rhythm: Normal rate and regular rhythm.     Pulses: Normal pulses.     Heart  sounds: Normal heart sounds.  Pulmonary:     Effort: Pulmonary effort is normal. No respiratory distress.     Breath sounds: Normal breath sounds.  Abdominal:     Palpations: Abdomen is soft.     Tenderness: There is no abdominal tenderness. There is no guarding or rebound.  Musculoskeletal:     Comments: Bilateral pedal edema pitting No calf tenderness or asymmetrical lower leg swelling Bony tenderness on the fifth metatarsals of bilateral feet without abnormalities palpated Soft compartments Pain not out of proportion  Skin:    General: Skin is warm and dry.     Capillary Refill: Capillary refill takes less than 2 seconds.     Comments: No overlying skin color changes, bilateral feet not warm to palpation  Neurological:     Mental Status: He is alert.     Comments: Sensation intact distally     ED Results / Procedures / Treatments   Labs (all labs ordered are listed, but only abnormal results are displayed) Labs Reviewed  BASIC METABOLIC PANEL - Abnormal; Notable for the following components:      Result Value   Chloride 97 (*)    Glucose, Bld 120 (*)    Anion gap 16 (*)    All other components within normal limits  HEPATIC FUNCTION PANEL - Abnormal; Notable for the following components:  AST 91 (*)    ALT 89 (*)    All other components within normal limits  CBC  BRAIN NATRIURETIC PEPTIDE    EKG None  Radiology DG Foot Complete Left  Result Date: 09/06/2023 CLINICAL DATA:  Bilateral foot pain and swelling. Injury 3 weeks ago. EXAM: LEFT FOOT - COMPLETE 3+ VIEW; RIGHT FOOT COMPLETE - 3+ VIEW COMPARISON:  05/13/2020 FINDINGS: No acute fracture or dislocation in the bilateral feet. Mild diffuse soft tissue swelling about both feet. IMPRESSION: No acute fracture or dislocation. Electronically Signed   By: Minerva Fester M.D.   On: 09/06/2023 19:56   DG Foot Complete Right  Result Date: 09/06/2023 CLINICAL DATA:  Bilateral foot pain and swelling. Injury 3 weeks ago.  EXAM: LEFT FOOT - COMPLETE 3+ VIEW; RIGHT FOOT COMPLETE - 3+ VIEW COMPARISON:  05/13/2020 FINDINGS: No acute fracture or dislocation in the bilateral feet. Mild diffuse soft tissue swelling about both feet. IMPRESSION: No acute fracture or dislocation. Electronically Signed   By: Minerva Fester M.D.   On: 09/06/2023 19:56    Procedures Procedures    Medications Ordered in ED Medications  ibuprofen (ADVIL) tablet 400 mg (400 mg Oral Given 09/06/23 2128)    ED Course/ Medical Decision Making/ A&P                                 Medical Decision Making Amount and/or Complexity of Data Reviewed Labs: ordered. Radiology: ordered.  Risk Prescription drug management.   Steven Kidd 37 y.o. presented today for bilateral leg edema. Working DDx that I considered at this time includes, but not limited to, dependent edema, venous insufficiency, thrombophlebitis, secondary to medications, CHF, edema, AKI, nephrotic syndrome.  R/o DDx: venous insufficiency, thrombophlebitis, secondary to medications, CHF, edema, AKI, nephrotic syndrome: These are considered less likely due to history of present illness, physical exam, labs/imaging findings.  Review of prior external notes: 07/23/2023 ED  Unique Tests and My Interpretation:  CBC: Unremarkable BMP: Mild anion gap most likely due to alcohol intake BN P: Unremarkable Hepatic function panel: Mild transaminitis, this is lower than previous result most likely due to alcohol use Left foot x-ray: Unremarkable Right foot x-ray: Unremarkable  Discussion with Independent Historian: None  Discussion of Management of Tests: None  Risk: Medium: prescription drug management  Risk Stratification Score: None  Plan: On exam patient was in no acute distress resting comfortably.  Patient was tender at the fifth metatarsal bilaterally without abnormalities and did not have pitting bilateral pedal edema without lower leg involvement.  No concern for DVT at  this time but will obtain x-rays and basic labs.  Patient endorsing any chest pain or shortness of breath and does not have history of cirrhosis, portal hypertension, congestive heart failure.  Patient's labs do show mild transaminitis which is actually lower than previously seen along with anion gap of 16 both which most likely due to his alcohol intake.  This was verbalized to the patient and encouraged patient to decrease alcohol use.  Patient given ibuprofen at his request and will follow-up with primary care provider.  I have prescribed the patient compression socks to help with his pedal edema.  Patient was in the ER for 5 hours and was not endorsing any chest pain or shortness of breath during his stay here and so chest x-ray and cardiac workup were not initiated at this time.  Patient was given return precautions. Patient  stable for discharge at this time.  Patient verbalized understanding of plan.         Final Clinical Impression(s) / ED Diagnoses Final diagnoses:  Pedal edema  Transaminitis    Rx / DC Orders ED Discharge Orders          Ordered    Compression stockings        09/06/23 2116              Netta Corrigan, PA-C 09/06/23 2220    Gwyneth Sprout, MD 09/06/23 2322

## 2023-09-06 NOTE — ED Triage Notes (Signed)
Pt BIB [facility] with reports of bilateral feet swelling x 3 weeks. Denies [facility]OB.

## 2023-09-06 NOTE — ED Provider Triage Note (Signed)
Emergency Medicine Provider Triage Evaluation Note  Steven Kidd , a 37 y.o. male  was evaluated in triage.  Pt complains of bilateral feet swelling. States that same has been ongoing x 1 week. No trauma or injuries. No history of similar. No leg swelling. No face or hand swelling. Urinating normally. No shortness of breath. No recent travel.  Review of Systems  Positive:  Negative:   Physical Exam  There were no vitals taken for this visit. Gen:   Awake, no distress   Resp:  Normal effort  MSK:   Moves extremities without difficulty  Other:  Non-pitting edema present bilaterally. No erythema or warmth. Left foot mildly tender, right foot nontender. No calf tenderness or leg swelling  Medical Decision Making  Medically screening exam initiated at 5:04 PM.  Appropriate orders placed.  Brenda Milum was informed that the remainder of the evaluation will be completed by another provider, this initial triage assessment does not replace that evaluation, and the importance of remaining in the ED until their evaluation is complete.     Silva Bandy, PA-C 09/06/23 [phone]

## 2023-09-06 NOTE — ED Notes (Signed)
Discharge instructions reviewed with pt. Pt alert and oriented, in no acute distress.

## 2024-01-02 ENCOUNTER — Emergency Department (HOSPITAL_COMMUNITY)
Admission: EM | Admit: 2024-01-02 | Discharge: 2024-01-03 | Disposition: A | Discharge status: Home / Self Care | Payer: 59 | Attending: Emergency Medicine | Admitting: Emergency Medicine

## 2024-01-02 ENCOUNTER — Other Ambulatory Visit: Payer: Self-pay

## 2024-01-02 DIAGNOSIS — Z79899 Other long term (current) drug therapy: Secondary | ICD-10-CM | POA: Insufficient documentation

## 2024-01-02 DIAGNOSIS — F10921 Alcohol use, unspecified with intoxication delirium: Secondary | ICD-10-CM | POA: Insufficient documentation

## 2024-01-02 DIAGNOSIS — R262 Difficulty in walking, not elsewhere classified: Secondary | ICD-10-CM | POA: Diagnosis not present

## 2024-01-02 DIAGNOSIS — Z743 Need for continuous supervision: Secondary | ICD-10-CM | POA: Diagnosis not present

## 2024-01-02 DIAGNOSIS — X31XXXA Exposure to excessive natural cold, initial encounter: Secondary | ICD-10-CM | POA: Insufficient documentation

## 2024-01-02 DIAGNOSIS — Y908 Blood alcohol level of 240 mg/100 ml or more: Secondary | ICD-10-CM | POA: Diagnosis not present

## 2024-01-02 DIAGNOSIS — F10121 Alcohol abuse with intoxication delirium: Secondary | ICD-10-CM | POA: Diagnosis not present

## 2024-01-02 DIAGNOSIS — F10929 Alcohol use, unspecified with intoxication, unspecified: Secondary | ICD-10-CM | POA: Diagnosis not present

## 2024-01-02 DIAGNOSIS — R2681 Unsteadiness on feet: Secondary | ICD-10-CM | POA: Diagnosis not present

## 2024-01-02 DIAGNOSIS — T68XXXA Hypothermia, initial encounter: Secondary | ICD-10-CM | POA: Insufficient documentation

## 2024-01-02 DIAGNOSIS — R4781 Slurred speech: Secondary | ICD-10-CM | POA: Diagnosis not present

## 2024-01-02 LAB — CBC WITH DIFFERENTIAL/PLATELET
Abs Immature Granulocytes: 0.01 10*3/uL (ref 0.00–0.07)
Basophils Absolute: 0 10*3/uL (ref 0.0–0.1)
Basophils Relative: 1 %
Eosinophils Absolute: 0.3 10*3/uL (ref 0.0–0.5)
Eosinophils Relative: 4 %
HCT: 48.6 % (ref 39.0–52.0)
Hemoglobin: 17.1 g/dL — ABNORMAL HIGH (ref 13.0–17.0)
Immature Granulocytes: 0 %
Lymphocytes Relative: 37 %
Lymphs Abs: 2.3 10*3/uL (ref 0.7–4.0)
MCH: 30 pg (ref 26.0–34.0)
MCHC: 35.2 g/dL (ref 30.0–36.0)
MCV: 85.3 fL (ref 80.0–100.0)
Monocytes Absolute: 0.2 10*3/uL (ref 0.1–1.0)
Monocytes Relative: 4 %
Neutro Abs: 3.5 10*3/uL (ref 1.7–7.7)
Neutrophils Relative %: 54 %
Platelets: 235 10*3/uL (ref 150–400)
RBC: 5.7 MIL/uL (ref 4.22–5.81)
RDW: 12.7 % (ref 11.5–15.5)
WBC: 6.4 10*3/uL (ref 4.0–10.5)
nRBC: 0 % (ref 0.0–0.2)

## 2024-01-02 LAB — RAPID URINE DRUG SCREEN, HOSP PERFORMED
Amphetamines: NOT DETECTED
Barbiturates: NOT DETECTED
Benzodiazepines: NOT DETECTED
Cocaine: NOT DETECTED
Opiates: NOT DETECTED
Tetrahydrocannabinol: NOT DETECTED

## 2024-01-02 LAB — COMPREHENSIVE METABOLIC PANEL
ALT: 34 U/L (ref 0–44)
AST: 43 U/L — ABNORMAL HIGH (ref 15–41)
Albumin: 4.9 g/dL (ref 3.5–5.0)
Alkaline Phosphatase: 58 U/L (ref 38–126)
Anion gap: 8 (ref 5–15)
BUN: 8 mg/dL (ref 6–20)
CO2: 27 mmol/L (ref 22–32)
Calcium: 9 mg/dL (ref 8.9–10.3)
Chloride: 109 mmol/L (ref 98–111)
Creatinine, Ser: 0.46 mg/dL — ABNORMAL LOW (ref 0.61–1.24)
GFR, Estimated: >60 mL/min (ref >60)
Glucose, Bld: 107 mg/dL — ABNORMAL HIGH (ref 70–99)
Potassium: 3.8 mmol/L (ref 3.5–5.1)
Sodium: 144 mmol/L (ref 135–145)
Total Bilirubin: 0.7 mg/dL (ref 0.0–1.2)
Total Protein: 8.6 g/dL — ABNORMAL HIGH (ref 6.5–8.1)

## 2024-01-02 LAB — ETHANOL: Alcohol, Ethyl (B): 417 mg/dL (ref <10)

## 2024-01-02 NOTE — ED Notes (Signed)
 Pt taken off bear hugger warmer due to pt temp improving
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
## 2024-01-02 NOTE — ED Notes (Signed)
 After pt rectal temp taken, pt placed under warmer.
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
## 2024-01-03 DIAGNOSIS — F10921 Alcohol use, unspecified with intoxication delirium: Secondary | ICD-10-CM | POA: Diagnosis not present

## 2024-01-03 MED ORDER — IBUPROFEN 800 MG PO TABS
800.0000 mg | ORAL_TABLET | Freq: Once | ORAL | Status: AC
  Administered 2024-01-03: 800 mg via ORAL
  Filled 2024-01-03: qty 1

## 2024-01-03 NOTE — ED Provider Notes (Signed)
  5:19 AM Patient has been observed here overnight.  He is able to get out of bed and ambulate, has tolerated oral food and fluids.  His vitals remained stable.  He is now normothermic.  Patient states he usually stays near [employer].  Will give him a bus pass this morning for transportation back there as he does not meet criteria for [facility] transport.   Jarold Olam HERO, PA-C 01/03/24 9398    Bari Charmaine FALCON, MD 01/04/24 [phone]

## 2024-01-03 NOTE — ED Notes (Addendum)
 Pt awakened by staff. Pt informed of discharge status and given bus pas and food. Pt informed EMT-P of some back pain and asked for ibuprofen for such. Provider notified.

## 2024-01-03 NOTE — ED Notes (Signed)
 Pt woke up and ambulated to the nurses desk. Pt asked for water and was given such. Pt states he does not remember how he got to the hospital. Pt currently complains of feeling weak but denies pain. Pt concerned about getting [facility] to [facility].

## 2024-01-04 ENCOUNTER — Encounter (HOSPITAL_COMMUNITY): Payer: Self-pay

## 2024-01-04 ENCOUNTER — Emergency Department (HOSPITAL_COMMUNITY)
Admission: EM | Admit: 2024-01-04 | Discharge: 2024-01-04 | Disposition: A | Discharge status: Home / Self Care | Payer: 59 | Attending: Emergency Medicine | Admitting: Emergency Medicine

## 2024-01-04 ENCOUNTER — Other Ambulatory Visit: Payer: Self-pay

## 2024-01-04 DIAGNOSIS — Z20822 Contact with and (suspected) exposure to covid-19: Secondary | ICD-10-CM | POA: Insufficient documentation

## 2024-01-04 DIAGNOSIS — J101 Influenza due to other identified influenza virus with other respiratory manifestations: Secondary | ICD-10-CM | POA: Diagnosis not present

## 2024-01-04 DIAGNOSIS — J09X2 Influenza due to identified novel influenza A virus with other respiratory manifestations: Secondary | ICD-10-CM | POA: Insufficient documentation

## 2024-01-04 DIAGNOSIS — R059 Cough, unspecified: Secondary | ICD-10-CM | POA: Diagnosis not present

## 2024-01-04 DIAGNOSIS — R6889 Other general symptoms and signs: Secondary | ICD-10-CM | POA: Diagnosis not present

## 2024-01-04 DIAGNOSIS — J111 Influenza due to unidentified influenza virus with other respiratory manifestations: Secondary | ICD-10-CM

## 2024-01-04 DIAGNOSIS — Z743 Need for continuous supervision: Secondary | ICD-10-CM | POA: Diagnosis not present

## 2024-01-04 LAB — RESP PANEL BY RT-PCR (RSV, FLU A&B, COVID)  RVPGX2
Influenza A by PCR: POSITIVE — AB
Influenza B by PCR: NEGATIVE
Resp Syncytial Virus by PCR: NEGATIVE
SARS Coronavirus 2 by RT PCR: NEGATIVE

## 2024-01-04 MED ORDER — ONDANSETRON 4 MG PO TBDP
4.0000 mg | ORAL_TABLET | Freq: Once | ORAL | Status: AC
  Administered 2024-01-04: 4 mg via ORAL
  Filled 2024-01-04: qty 1

## 2024-01-04 MED ORDER — ONDANSETRON 4 MG PO TBDP: 4.0000 mg | ORAL_TABLET | Freq: Three times a day (TID) | ORAL | 0 refills | Status: DC | PRN

## 2024-01-04 MED ORDER — OSELTAMIVIR PHOSPHATE 75 MG PO CAPS: 75.0000 mg | ORAL_CAPSULE | Freq: Two times a day (BID) | ORAL | 0 refills | Status: DC
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
## 2024-01-04 NOTE — Discharge Instructions (Signed)
 You have the flu.  Have sent Tamiflu into the pharmacy.  Take Tylenol ibuprofen as you need to for muscle aches and fever.  Drink plenty of fluids.  Have sent nausea medicine and as well.  Return for any emergent symptoms.

## 2024-01-04 NOTE — ED Triage Notes (Signed)
 Pt came to ED for flu like sx that started yesterday. Cough and body aches. Axox4. Ambulatory.

## 2024-01-08 ENCOUNTER — Ambulatory Visit (HOSPITAL_COMMUNITY)
Admission: EM | Admit: 2024-01-08 | Discharge: 2024-01-08 | Disposition: A | Discharge status: Home / Self Care | Payer: 59 | Attending: Family Medicine | Admitting: Family Medicine

## 2024-01-08 ENCOUNTER — Encounter (HOSPITAL_COMMUNITY): Payer: Self-pay

## 2024-01-08 DIAGNOSIS — M7989 Other specified soft tissue disorders: Secondary | ICD-10-CM | POA: Diagnosis not present

## 2024-01-08 DIAGNOSIS — M792 Neuralgia and neuritis, unspecified: Secondary | ICD-10-CM

## 2024-01-08 NOTE — ED Triage Notes (Signed)
Pt c/o swelling and pain to both hands x1wk. States went to ED and told him to take tylenol. States took tylenol last night.
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
## 2024-04-16 ENCOUNTER — Encounter (HOSPITAL_COMMUNITY): Payer: Self-pay

## 2024-04-16 ENCOUNTER — Ambulatory Visit (INDEPENDENT_AMBULATORY_CARE_PROVIDER_SITE_OTHER)

## 2024-04-16 ENCOUNTER — Ambulatory Visit (HOSPITAL_COMMUNITY)
Admission: EM | Admit: 2024-04-16 | Discharge: 2024-04-16 | Disposition: A | Discharge status: Home / Self Care | Attending: Emergency Medicine | Admitting: Emergency Medicine

## 2024-04-16 DIAGNOSIS — M25512 Pain in left shoulder: Secondary | ICD-10-CM

## 2024-04-16 DIAGNOSIS — S4992XA Unspecified injury of left shoulder and upper arm, initial encounter: Secondary | ICD-10-CM | POA: Diagnosis not present

## 2024-04-16 MED ORDER — NAPROXEN 500 MG PO TABS: 500.0000 mg | ORAL_TABLET | Freq: Two times a day (BID) | ORAL | 0 refills | Status: AC

## 2024-04-16 NOTE — ED Triage Notes (Signed)
 Patient presenting with left shoulder injury while playing soccer onset 3 weeks ago. Patient has not been seen for this injury yet.  Unable to fully lift the shoulder.  Prescriptions or OTC medications tried: No

## 2024-04-16 NOTE — ED Provider Notes (Signed)
 [facility]    CSN: [id number] Arrival date & time: 04/16/24  7829      History   Chief Complaint Chief Complaint  Patient presents with   Shoulder Injury    HPI Steven Kidd is a 38 y.o. male.   Patient presents with left shoulder pain x 3 weeks.  Patient states that he was playing soccer 3 weeks ago and fell injuring his shoulder.  Patient states that normally when he is injured he heals pretty quickly, but states that he is still having pain and is concerned.  Patient reports worsening pain with movement.  Patient states he is unable to lift his shoulder without pain.  Denies taking any medications for pain.  Denies any other injuries from fall.  The history is provided by the patient and medical records.  Shoulder Injury    History reviewed. No pertinent past medical history.  There are no active problems to display for this patient.   History reviewed. No pertinent surgical history.     Home Medications    Prior to Admission medications   Medication Sig Start Date End Date Taking? Authorizing Provider  naproxen  (NAPROSYN ) 500 MG tablet Take 1 tablet (500 mg total) by mouth 2 (two) times daily. 04/16/24  Yes Karon Packer, NP    Family History Family History  Family history unknown: Yes    Social History Social History   Tobacco Use   Smoking status: Never   Smokeless tobacco: Never  Vaping Use   Vaping status: Never Used  Substance Use Topics   Alcohol use: Yes    Comment: Socially   Drug use: No     Allergies   Patient has no known allergies.   Review of Systems Review of Systems  Per HPI  Physical Exam Triage Vital Signs ED Triage Vitals  Encounter Vitals Group     BP 04/16/24 0911 130/83     Systolic BP Percentile --      Diastolic BP Percentile --      Pulse Rate 04/16/24 0911 80     Resp 04/16/24 0911 16     Temp 04/16/24 0911 97.9 F (36.6 C)     Temp Source 04/16/24 0911 Oral     SpO2 04/16/24 0911 98 %      Weight --      Height --      Head Circumference --      Peak Flow --      Pain Score 04/16/24 0910 5     Pain Loc --      Pain Education --      Exclude from Growth Chart --    No data found.  Updated Vital Signs BP 130/83 (BP Location: Right Arm)   Pulse 80   Temp 97.9 F (36.6 C) (Oral)   Resp 16   SpO2 98%   Visual Acuity Right Eye Distance:   Left Eye Distance:   Bilateral Distance:    Right Eye Near:   Left Eye Near:    Bilateral Near:     Physical Exam Vitals and nursing note reviewed.  Constitutional:      General: He is awake. He is not in acute distress.    Appearance: Normal appearance. He is well-developed and well-groomed. He is not ill-appearing.  Musculoskeletal:     Left shoulder: Tenderness and bony tenderness present. No swelling or deformity. Normal range of motion.  Skin:    General: Skin is warm and dry.  Neurological:     Mental Status: He is alert.  Psychiatric:        Behavior: Behavior is cooperative.      UC Treatments / Results  Labs (all labs ordered are listed, but only abnormal results are displayed) Labs Reviewed - No data to display  EKG   Radiology DG Shoulder Left Result Date: 04/16/2024 CLINICAL DATA:  Shoulder pain following soccer injury, initial encounter EXAM: LEFT SHOULDER - 2+ VIEW COMPARISON:  None Available. FINDINGS: No acute fracture or dislocation is noted. There may be some very mild widening of the acromioclavicular joint. No soft tissue abnormality is noted. IMPRESSION: Questionable widening of the acromioclavicular joint. No other focal abnormality is noted. Correlate to point tenderness. Electronically Signed   By: Violeta Grey M.D.   On: 04/16/2024 10:28    Procedures Procedures (including critical care time)  Medications Ordered in UC Medications - No data to display  Initial Impression / Assessment and Plan / UC Course  I have reviewed the triage vital signs and the nursing notes.  Pertinent  labs & imaging results that were available during my care of the patient were reviewed by me and considered in my medical decision making (see chart for details).     Patient is well-appearing.  Vitals are stable.  Tenderness noted to shoulder joint.  Patient has increased pain with shoulder flexion, abduction, and internal rotation.  X-ray ordered.  Based on my interpretation there is some suspicion for underlying injury due to possible widening of the [facility] joint.  Radiology report also reveals questionable widening of the [facility] joint without acute fracture or dislocation noted.  Provided patient with sling and given  [facility] follow-up.   Prescribed naproxen  as needed for pain.  Discussed follow-up and return precautions. Final Clinical Impressions(s) / UC Diagnoses   Final diagnoses:  Pain in joint of left shoulder  Injury of left shoulder, initial encounter     Discharge Instructions      Wear the sling to help stabilize the joint. Take naproxen  twice daily as needed for pain.  Do not take this other NSAIDs including ibuprofen , Motrin , Advil , Aleve , and Goody powder as this could increase your risk for gastrointestinal bleeding. Otherwise you can take 650 mg of Tylenol  every 4-6 hours as needed for breakthrough pain. Follow-up with [facility] if pain persists. Return here as needed.     ED Prescriptions     Medication Sig Dispense Auth. Provider   naproxen  (NAPROSYN ) 500 MG tablet Take 1 tablet (500 mg total) by mouth 2 (two) times daily. 30 tablet Levora Reas A, NP      PDMP not reviewed this encounter.   Levora Reas A, NP 04/16/24 1038

## 2024-04-16 NOTE — Discharge Instructions (Signed)
 Wear the sling to help stabilize the joint. Take naproxen  twice daily as needed for pain.  Do not take this other NSAIDs including ibuprofen , Motrin , Advil , Aleve , and Goody powder as this could increase your risk for gastrointestinal bleeding. Otherwise you can take 650 mg of Tylenol  every 4-6 hours as needed for breakthrough pain. Follow-up with [facility] if pain persists. Return here as needed.

## 2024-05-18 ENCOUNTER — Encounter (HOSPITAL_COMMUNITY): Payer: Self-pay | Admitting: Emergency Medicine

## 2024-05-18 ENCOUNTER — Other Ambulatory Visit: Payer: Self-pay

## 2024-05-18 ENCOUNTER — Emergency Department (HOSPITAL_COMMUNITY)

## 2024-05-18 ENCOUNTER — Emergency Department (HOSPITAL_COMMUNITY)
Admission: EM | Admit: 2024-05-18 | Discharge: 2024-05-18 | Disposition: A | Discharge status: Home / Self Care | Attending: Emergency Medicine | Admitting: Emergency Medicine

## 2024-05-18 DIAGNOSIS — Z23 Encounter for immunization: Secondary | ICD-10-CM | POA: Diagnosis not present

## 2024-05-18 DIAGNOSIS — R69 Illness, unspecified: Secondary | ICD-10-CM | POA: Diagnosis not present

## 2024-05-18 DIAGNOSIS — S61217A Laceration without foreign body of left little finger without damage to nail, initial encounter: Secondary | ICD-10-CM | POA: Diagnosis not present

## 2024-05-18 DIAGNOSIS — S0990XA Unspecified injury of head, initial encounter: Secondary | ICD-10-CM | POA: Diagnosis not present

## 2024-05-18 DIAGNOSIS — S61216A Laceration without foreign body of right little finger without damage to nail, initial encounter: Secondary | ICD-10-CM | POA: Diagnosis not present

## 2024-05-18 DIAGNOSIS — R220 Localized swelling, mass and lump, head: Secondary | ICD-10-CM | POA: Diagnosis not present

## 2024-05-18 DIAGNOSIS — S078XXA Crushing injury of other parts of head, initial encounter: Secondary | ICD-10-CM | POA: Diagnosis not present

## 2024-05-18 MED ORDER — TETANUS-DIPHTH-ACELL PERTUSSIS 5-2.5-18.5 LF-MCG/0.5 IM SUSY
0.5000 mL | PREFILLED_SYRINGE | Freq: Once | INTRAMUSCULAR | Status: AC
Start: 2024-05-18 — End: 2024-05-18
  Administered 2024-05-18: 0.5 mL via INTRAMUSCULAR
  Filled 2024-05-18: qty 0.5
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
## 2024-05-18 NOTE — Discharge Instructions (Signed)
 Your scan looked good.  The glue will fall off when it is ready.  Monitor for signs of infection such as redness, discharge, or increased pain.
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
